# Patient Record
Sex: Female | Born: 1960
Health system: Southern US, Community
[De-identification: ages and names within clinical notes are randomized; demographics above are authoritative.]

## PROBLEM LIST (undated history)

## (undated) DIAGNOSIS — M8580 Other specified disorders of bone density and structure, unspecified site: Secondary | ICD-10-CM

## (undated) DIAGNOSIS — H01009 Unspecified blepharitis unspecified eye, unspecified eyelid: Secondary | ICD-10-CM

## (undated) DIAGNOSIS — K6289 Other specified diseases of anus and rectum: Secondary | ICD-10-CM

## (undated) DIAGNOSIS — N890 Mild vaginal dysplasia: Secondary | ICD-10-CM

## (undated) DIAGNOSIS — G43109 Migraine with aura, not intractable, without status migrainosus: Secondary | ICD-10-CM

## (undated) DIAGNOSIS — R32 Unspecified urinary incontinence: Secondary | ICD-10-CM

## (undated) DIAGNOSIS — I493 Ventricular premature depolarization: Secondary | ICD-10-CM

## (undated) DIAGNOSIS — D069 Carcinoma in situ of cervix, unspecified: Secondary | ICD-10-CM

## (undated) DIAGNOSIS — Z8619 Personal history of other infectious and parasitic diseases: Secondary | ICD-10-CM

## (undated) HISTORY — PX: PILONIDAL CYST EXCISION: SHX744

## (undated) HISTORY — DX: Ventricular premature depolarization: I49.3

## (undated) HISTORY — DX: Personal history of other infectious and parasitic diseases: Z86.19

## (undated) HISTORY — DX: Migraine with aura, not intractable, without status migrainosus: G43.109

## (undated) HISTORY — DX: Other specified disorders of bone density and structure, unspecified site: M85.80

## (undated) HISTORY — DX: Unspecified urinary incontinence: R32

## (undated) HISTORY — PX: VAGINAL HYSTERECTOMY: SUR661

## (undated) HISTORY — PX: GALLBLADDER SURGERY: SHX652

## (undated) HISTORY — DX: Unspecified blepharitis unspecified eye, unspecified eyelid: H01.009

## (undated) HISTORY — DX: Carcinoma in situ of cervix, unspecified: D06.9

## (undated) HISTORY — DX: Other specified diseases of anus and rectum: K62.89

## (undated) HISTORY — DX: Mild vaginal dysplasia: N89.0

---

## 1998-11-19 ENCOUNTER — Emergency Department (HOSPITAL_COMMUNITY): Admission: EM | Admit: 1998-11-19 | Discharge: 1998-11-19 | Payer: Self-pay | Admitting: Emergency Medicine

## 1999-06-21 ENCOUNTER — Emergency Department (HOSPITAL_COMMUNITY): Admission: EM | Admit: 1999-06-21 | Discharge: 1999-06-21 | Payer: Self-pay | Admitting: Emergency Medicine

## 1999-06-21 ENCOUNTER — Encounter: Payer: Self-pay | Admitting: Emergency Medicine

## 1999-07-01 ENCOUNTER — Ambulatory Visit (HOSPITAL_COMMUNITY): Admission: RE | Admit: 1999-07-01 | Discharge: 1999-07-02 | Payer: Self-pay | Admitting: General Surgery

## 2000-06-17 ENCOUNTER — Emergency Department (HOSPITAL_COMMUNITY): Admission: EM | Admit: 2000-06-17 | Discharge: 2000-06-17 | Payer: Self-pay

## 2001-02-24 ENCOUNTER — Emergency Department (HOSPITAL_COMMUNITY): Admission: EM | Admit: 2001-02-24 | Discharge: 2001-02-24 | Payer: Self-pay | Admitting: Emergency Medicine

## 2001-02-24 ENCOUNTER — Encounter: Payer: Self-pay | Admitting: Emergency Medicine

## 2010-10-13 DIAGNOSIS — D069 Carcinoma in situ of cervix, unspecified: Secondary | ICD-10-CM

## 2010-10-13 HISTORY — PX: OTHER SURGICAL HISTORY: SHX169

## 2010-10-13 HISTORY — DX: Carcinoma in situ of cervix, unspecified: D06.9

## 2011-01-10 ENCOUNTER — Other Ambulatory Visit: Payer: Self-pay | Admitting: Orthopedic Surgery

## 2011-01-10 ENCOUNTER — Ambulatory Visit (HOSPITAL_COMMUNITY)
Admission: RE | Admit: 2011-01-10 | Discharge: 2011-01-10 | Disposition: A | Discharge status: Home / Self Care | Payer: Commercial Managed Care - PPO | Source: Ambulatory Visit | Attending: Orthopedic Surgery | Admitting: Orthopedic Surgery

## 2011-01-10 DIAGNOSIS — X58XXXA Exposure to other specified factors, initial encounter: Secondary | ICD-10-CM | POA: Insufficient documentation

## 2011-01-10 DIAGNOSIS — T148XXA Other injury of unspecified body region, initial encounter: Secondary | ICD-10-CM

## 2011-01-10 DIAGNOSIS — S52509A Unspecified fracture of the lower end of unspecified radius, initial encounter for closed fracture: Secondary | ICD-10-CM | POA: Insufficient documentation

## 2011-01-10 DIAGNOSIS — S52609A Unspecified fracture of lower end of unspecified ulna, initial encounter for closed fracture: Secondary | ICD-10-CM | POA: Insufficient documentation

## 2011-01-28 ENCOUNTER — Ambulatory Visit (HOSPITAL_BASED_OUTPATIENT_CLINIC_OR_DEPARTMENT_OTHER)
Admission: RE | Admit: 2011-01-28 | Discharge: 2011-01-29 | Disposition: A | Discharge status: Home / Self Care | Payer: 59 | Source: Ambulatory Visit | Attending: Gynecology | Admitting: Gynecology

## 2011-01-28 ENCOUNTER — Other Ambulatory Visit: Payer: Self-pay | Admitting: Gynecology

## 2011-01-28 DIAGNOSIS — D069 Carcinoma in situ of cervix, unspecified: Secondary | ICD-10-CM | POA: Insufficient documentation

## 2011-01-28 DIAGNOSIS — Z01812 Encounter for preprocedural laboratory examination: Secondary | ICD-10-CM | POA: Insufficient documentation

## 2011-01-28 LAB — HEMOGLOBIN AND HEMATOCRIT, BLOOD
HCT: 43 % (ref 36.0–46.0)
Hemoglobin: 14.6 g/dL (ref 12.0–15.0)

## 2011-01-28 LAB — POCT HEMOGLOBIN-HEMACUE: Hemoglobin: 14.5 g/dL (ref 12.0–15.0)

## 2011-01-28 LAB — POCT PREGNANCY, URINE: Preg Test, Ur: NEGATIVE

## 2011-03-12 ENCOUNTER — Other Ambulatory Visit (HOSPITAL_COMMUNITY): Payer: Self-pay | Admitting: Orthopedic Surgery

## 2011-03-12 DIAGNOSIS — R52 Pain, unspecified: Secondary | ICD-10-CM

## 2011-03-17 ENCOUNTER — Inpatient Hospital Stay (HOSPITAL_COMMUNITY): Admission: RE | Admit: 2011-03-17 | Payer: 59 | Source: Ambulatory Visit

## 2011-09-09 ENCOUNTER — Other Ambulatory Visit: Payer: Self-pay | Admitting: Gynecology

## 2012-03-16 ENCOUNTER — Other Ambulatory Visit: Payer: Self-pay | Admitting: Gynecology

## 2012-06-11 ENCOUNTER — Emergency Department (HOSPITAL_COMMUNITY)
Admission: EM | Admit: 2012-06-11 | Discharge: 2012-06-11 | Disposition: A | Discharge status: Home / Self Care | Payer: 59 | Source: Home / Self Care | Attending: Emergency Medicine | Admitting: Emergency Medicine

## 2012-06-11 ENCOUNTER — Encounter (HOSPITAL_COMMUNITY): Payer: Self-pay | Admitting: Emergency Medicine

## 2012-06-11 DIAGNOSIS — J40 Bronchitis, not specified as acute or chronic: Secondary | ICD-10-CM

## 2012-06-11 MED ORDER — HYDROCOD POLST-CHLORPHEN POLST 10-8 MG/5ML PO LQCR: 5.0000 mL | Freq: Two times a day (BID) | ORAL | Status: DC | PRN

## 2012-06-11 MED ORDER — ALBUTEROL SULFATE HFA 108 (90 BASE) MCG/ACT IN AERS: 1.0000 | INHALATION_SPRAY | Freq: Four times a day (QID) | RESPIRATORY_TRACT | Status: DC | PRN

## 2012-06-11 MED ORDER — PREDNISONE 5 MG PO KIT: 1.0000 | PACK | Freq: Every day | ORAL | Status: DC

## 2012-06-11 NOTE — ED Provider Notes (Signed)
Chief Complaint  Patient presents with  . 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 She's had some soreness in her chest but no wheezing or tightness. She's also had nasal congestion with clear drainage, sinus pressure, sore throat, postnasal drip, and low-grade fever.  Review of Systems:  Other than noted above, the patient denies any of the following symptoms. Systemic:  No fever, chills, sweats, fatigue, myalgias, headache, or anorexia. Eye:  No redness, pain or drainage. ENT:  No earache, ear congestion, nasal congestion, sneezing, rhinorrhea, sinus pressure, sinus pain, post nasal drip, or sore throat. Lungs:  No cough, sputum production, wheezing, shortness of breath, or chest pain. GI:  No abdominal pain, nausea, vomiting, or diarrhea.  PMFSH:  Past medical history, family history, social history, meds, and allergies were reviewed.  Physical Exam:   Vital signs:  BP 156/112  Pulse 96  Temp 98.7 F (37.1 C) (Oral)  Resp 18  SpO2 99% General:  Alert, in no distress. Eye:  No conjunctival injection or drainage. Lids were normal. ENT:  TMs and canals were normal, without erythema or inflammation.  Nasal mucosa was clear and uncongested, without drainage.  Mucous membranes were moist.  Pharynx was clear, without exudate or drainage.  There were no oral ulcerations or lesions. Neck:  Supple, no adenopathy, tenderness or mass. Lungs:  No respiratory distress.  Lungs were clear to auscultation, without wheezes, rales or rhonchi.  Breath sounds were clear and equal bilaterally.  Heart:  Regular rhythm, without gallops, murmers or rubs. Skin:  Clear, warm, and dry, without rash or lesions.  Assessment:  The encounter diagnosis was Bronchitis.  Plan:   1.  The following meds were prescribed:   New Prescriptions   ALBUTEROL  (PROVENTIL HFA;VENTOLIN HFA) 108 (90 BASE) MCG/ACT INHALER    Inhale 1-2 puffs into the lungs every 6 (six) hours as needed for wheezing.   CHLORPHENIRAMINE-HYDROCODONE (TUSSIONEX) 10-8 MG/5ML LQCR    Take 5 mLs by mouth every 12 (twelve) hours as needed.   PREDNISONE 5 MG KIT    Take 1 kit (5 mg total) by mouth daily after breakfast. Prednisone 5 mg 6 day dosepack.  Take as directed.   2.  The patient was instructed in symptomatic care and handouts were given. 3.  The patient was told to return if becoming worse in any way, if no better in 3 or 4 days, and given some red flag symptoms that would indicate earlier return.   Reuben Likes, MD 06/11/12 2126

## 2012-06-11 NOTE — ED Notes (Signed)
Pt c/o dry cough x7 days... States she is just getting over Bronchitis.

## 2012-07-30 IMAGING — CT CT 3D ACQUISTION WKST
3 of 5 series · 8 of 33 positions shown, 9 images · non-contrast
Comparison: None

CLINICAL DATA: Right wrist fracture.

CT RIGHT WRIST WITHOUT CONTRAST
TECHNIQUE: Multidetector CT imaging of the right wrist was
performed according to the standard protocol without intravenous
contrast. Multiplanar CT image reconstructions were also generated.
TECHNIQUE: 3-dimensional CT images were rendered by post-
processing of the original CT data at the CT scanner.  The 3-
dimensional CT images were interpreted, and findings were reported
in the accompanying complete CT report for this study.

[axial supination st · axial · 0.20mm/px · z∈[+995,+1077]mm · 3 of 86 slices shown, 4 images]
[im 22/86  soft-tissue]
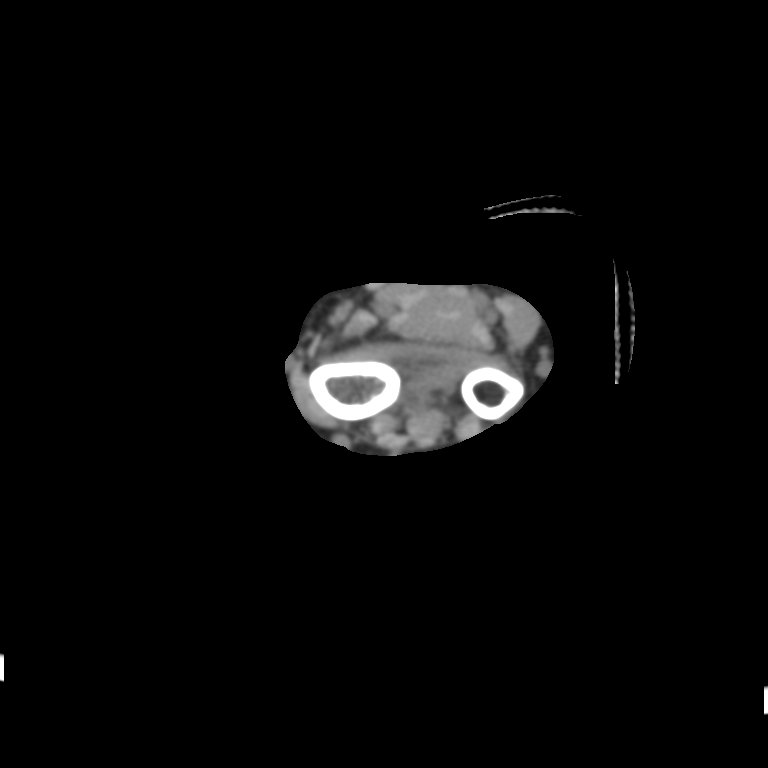
[im 22/86  bone]
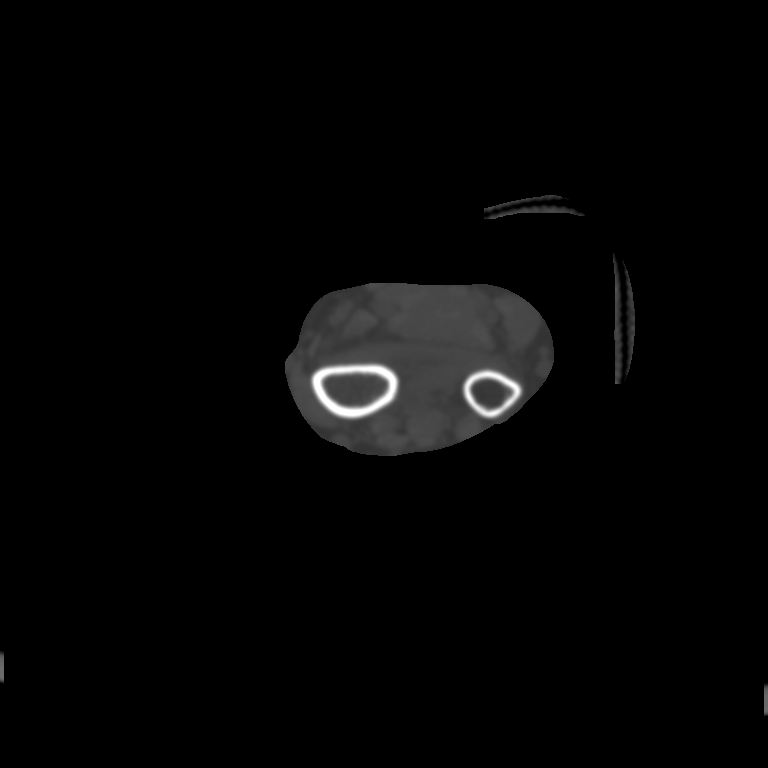
[im 43/86  bone]
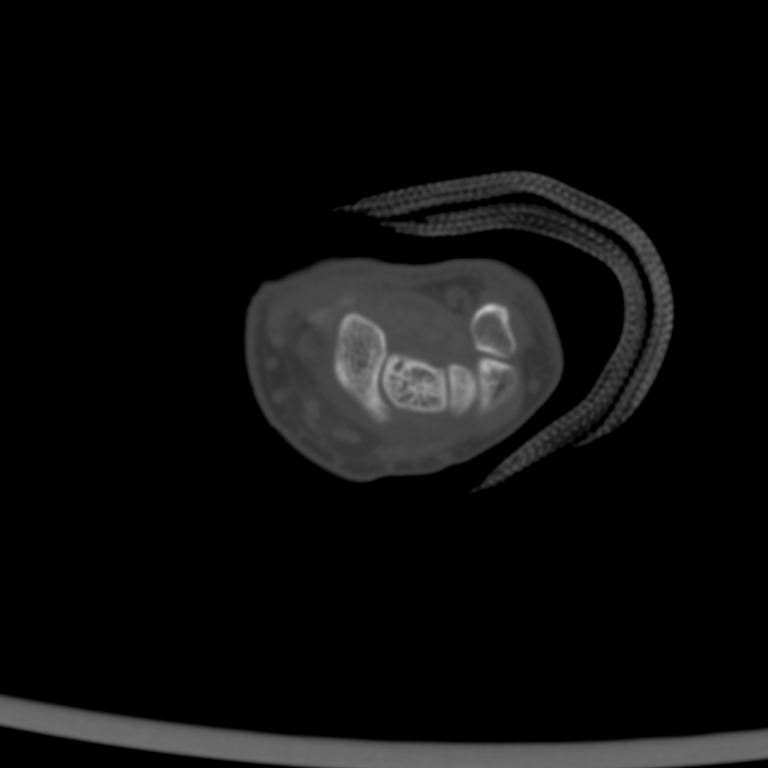
[im 64/86  bone]
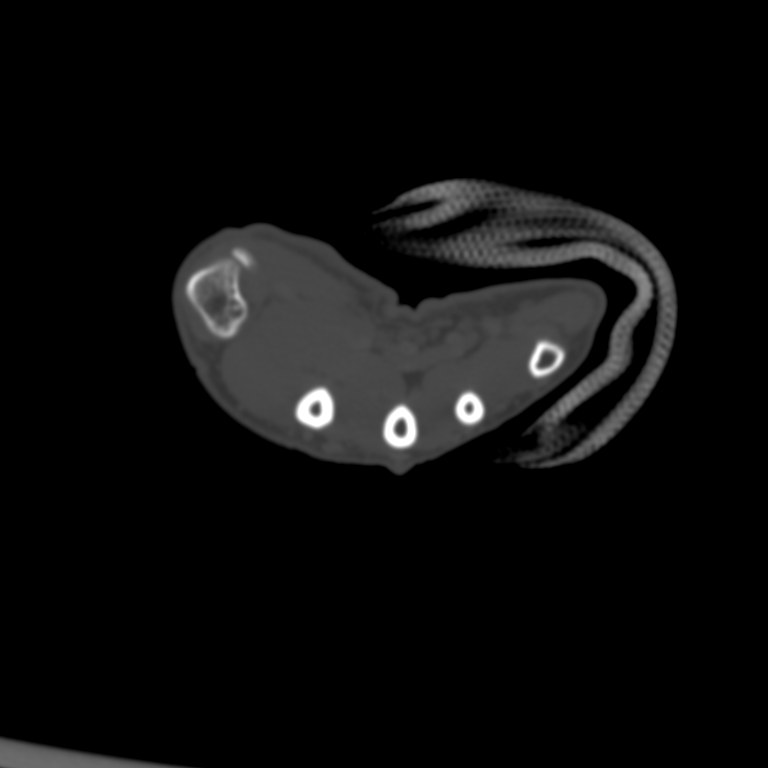

[axial bone supination · axial · 0.20mm/px · z∈[+1004,+1053]mm · 2 of 77 slices shown]
[im 26/77  bone]
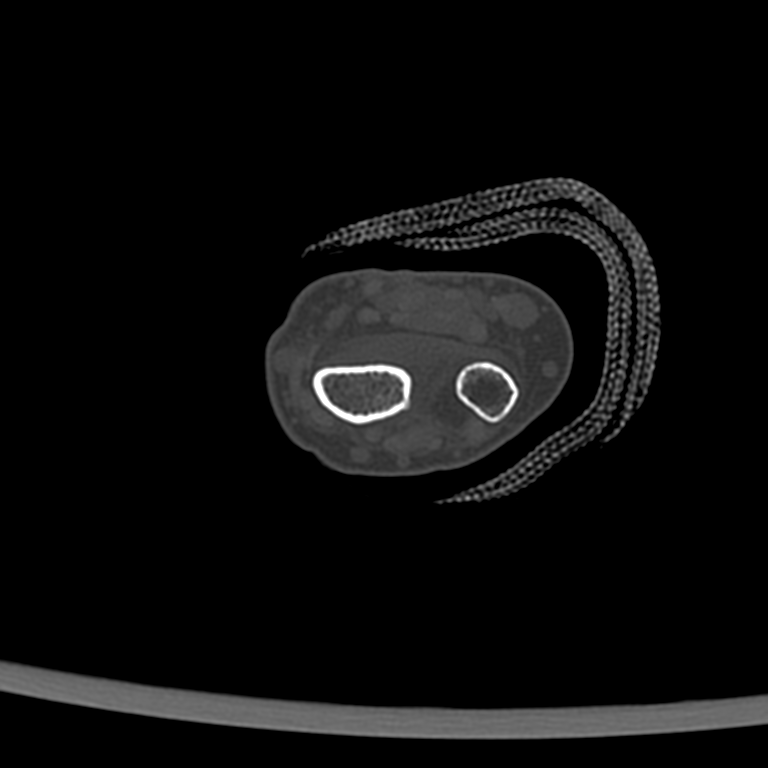
[im 51/77  bone]
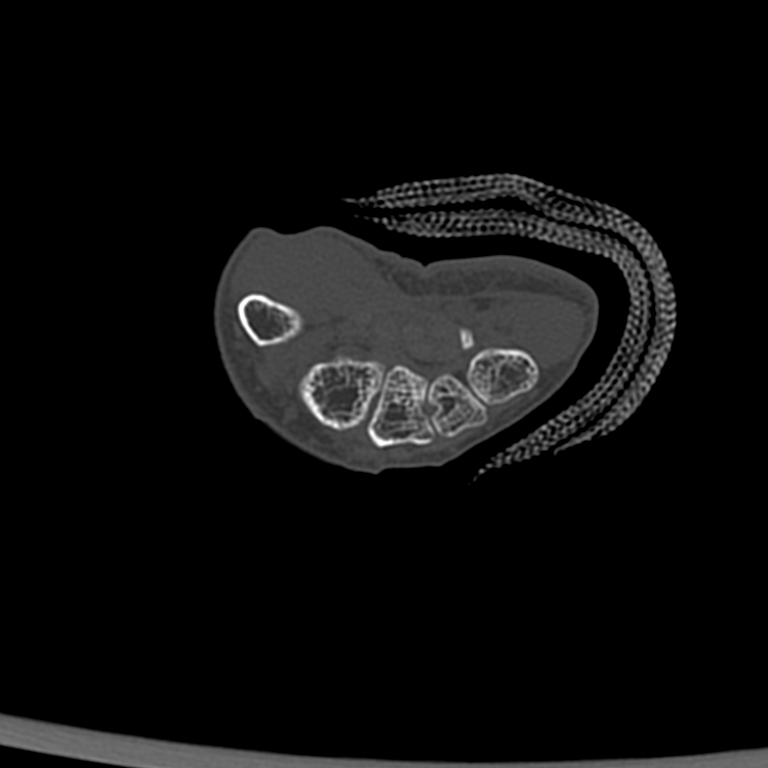

[coronal bone supination · coronal · 0.27mm/px · 3 of 23 slices shown]
[im 5/23  bone]
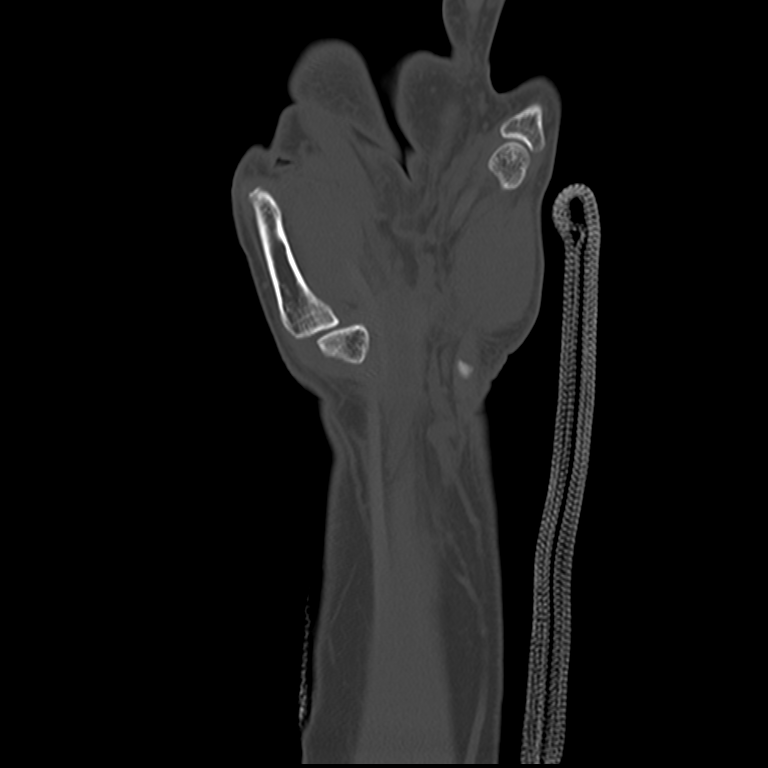
[im 9/23  bone]
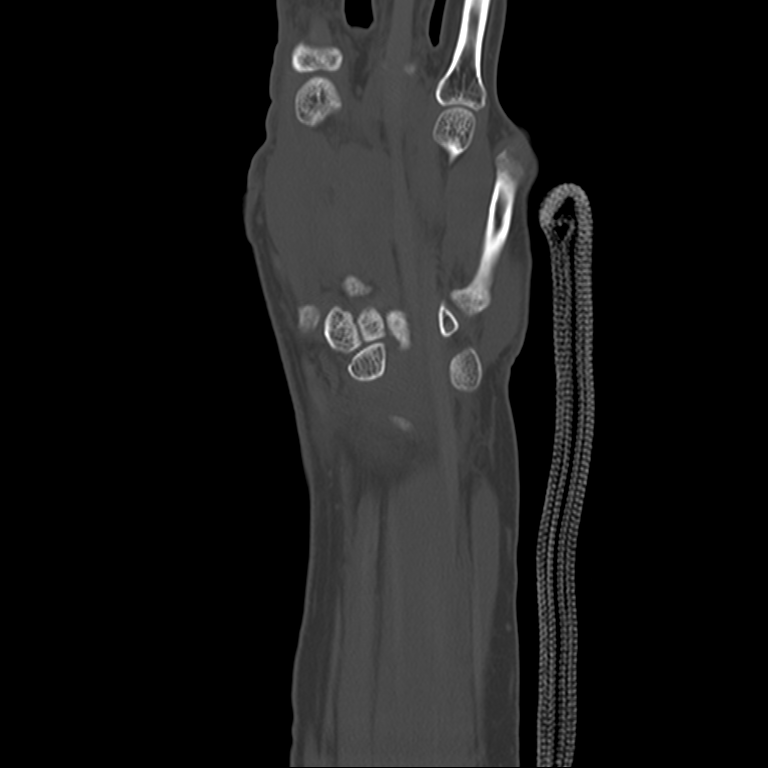
[im 14/23  bone]
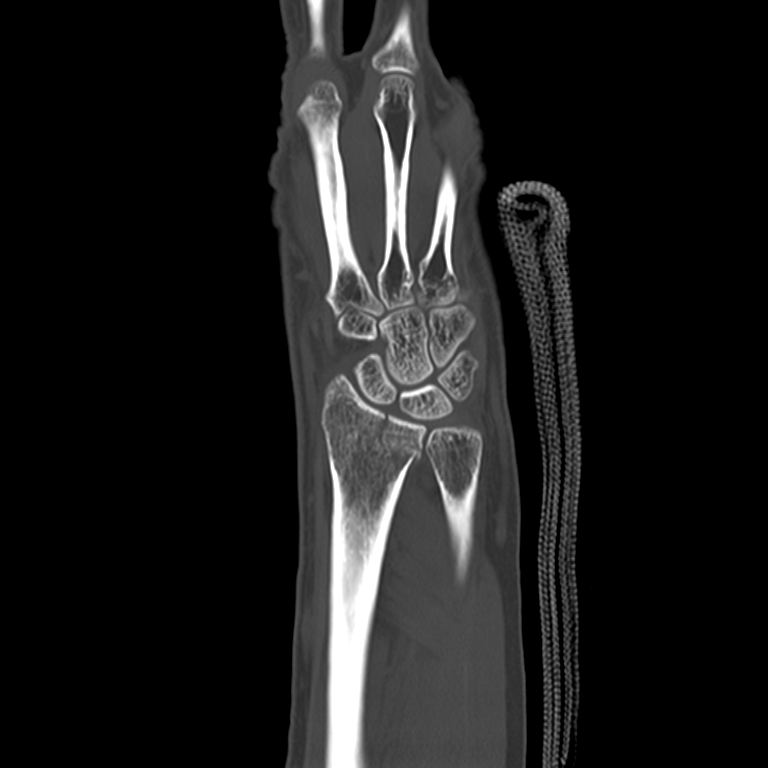

[8 of 33 positions shown; findings below may reference images not displayed]

FINDINGS: The wrist was scanned in neutral, supination and
pronation.  There is a nondisplaced minimally impacted intra-
articular fracture of the distal radius.  Less than 1 mm of
separation of the intra-articular fracture.  The intercarpal joint
spaces are maintained.  No carpal fractures are seen.  There is a
tiny avulsion fracture involving the ulnar styloid.  The radioulnar
joint spaces maintained with supination and pronation.
IMPRESSION: 1.  Nondisplaced intra-articular fracture of the distal radius
involving the ulnar aspect.  Minimal impaction along the ulnar
aspect but less than 1 cm of separation of the intra-articular
fracture.
2.  Tiny ulnar styloid avulsion fracture.
3.  No findings to suggest disruption of the radioulnar ligament.


3-DIMENSIONAL CT IMAGE RENDERING AT CT SCANNER:

## 2012-10-13 DIAGNOSIS — M8580 Other specified disorders of bone density and structure, unspecified site: Secondary | ICD-10-CM

## 2012-10-13 HISTORY — DX: Other specified disorders of bone density and structure, unspecified site: M85.80

## 2012-10-15 ENCOUNTER — Other Ambulatory Visit: Payer: Self-pay | Admitting: Gynecology

## 2013-02-15 ENCOUNTER — Emergency Department (HOSPITAL_COMMUNITY)
Admission: EM | Admit: 2013-02-15 | Discharge: 2013-02-15 | Disposition: A | Discharge status: Home / Self Care | Payer: Commercial Managed Care - PPO | Source: Home / Self Care

## 2013-02-15 ENCOUNTER — Encounter (HOSPITAL_COMMUNITY): Payer: Self-pay | Admitting: Emergency Medicine

## 2013-02-15 DIAGNOSIS — B029 Zoster without complications: Secondary | ICD-10-CM

## 2013-02-15 MED ORDER — OXYCODONE-ACETAMINOPHEN 5-325 MG PO TABS: 1.0000 | ORAL_TABLET | Freq: Four times a day (QID) | ORAL | Status: DC | PRN

## 2013-02-15 MED ORDER — VALACYCLOVIR HCL 1 G PO TABS: 1000.0000 mg | ORAL_TABLET | Freq: Three times a day (TID) | ORAL | Status: DC

## 2013-02-15 NOTE — ED Notes (Signed)
Pt was exposed to shingles 2 weeks ago. Last nigh felt sharp lumbar pain and then noticed a rash that has spread around to abdomen. No pus or weeping. Patient is alert and oriented.

## 2013-02-15 NOTE — ED Provider Notes (Signed)
History     CSN: [id number]  Arrival date & time 02/15/13  1412   First MD Initiated Contact with Patient 02/15/13 1510      Chief Complaint  Patient presents with  . Herpes Zoster    (Consider location/radiation/quality/duration/timing/severity/associated sxs/prior treatment) HPI Comments: 2 days ago this 52 year old female developed pain in her right mid back. The pain progressed over the ensuing 24 hours as she thought it was musculoskeletal. This was followed by a grouping of papulovesicular rash in the right posterior back. There is a band of pain and paresthesia along the eighth and ninth dermatome from the spine to right lateral abdomen just beyond the anterior axillary line. Denies fever or other constitutional symptoms.   Past Medical History  Diagnosis Date  . Cervical cancer     Past Surgical History  Procedure Laterality Date  . Gallbladder surgery    . Abdominal hysterectomy      No family history on file.  History  Substance Use Topics  . Smoking status: Never Smoker   . Smokeless tobacco: Not on file  . Alcohol Use: 0.6 oz/week    1 Glasses of wine per week     Comment: occasionally    OB History   Grav Para Term Preterm Abortions TAB SAB Ect Mult Living                  Review of Systems  Constitutional: Negative.   HENT: Negative.   Respiratory: Negative.   Skin: Positive for rash.  All other systems reviewed and are negative.    Allergies  Penicillins  Home Medications   Current Outpatient Rx  Name  Route  Sig  Dispense  Refill  . estradiol (VIVELLE-DOT) 0.05 MG/24HR   Transdermal   Place 1 patch onto the skin 1 day or 1 dose.         . zolpidem (AMBIEN) 5 MG tablet   Oral   Take 5 mg by mouth at bedtime as needed.         Marland Kitchen albuterol (PROVENTIL HFA;VENTOLIN HFA) 108 (90 BASE) MCG/ACT inhaler   Inhalation   Inhale 1-2 puffs into the lungs every 6 (six) hours as needed for wheezing.   1 Inhaler   0   .  chlorpheniramine-HYDROcodone (TUSSIONEX) 10-8 MG/5ML LQCR   Oral   Take 5 mLs by mouth every 12 (twelve) hours as needed.   140 mL   0   . oxyCODONE-acetaminophen (PERCOCET/ROXICET) 5-325 MG per tablet   Oral   Take 1-2 tablets by mouth every 6 (six) hours as needed for pain.   20 tablet   0   . PredniSONE 5 MG KIT   Oral   Take 1 kit (5 mg total) by mouth daily after breakfast. Prednisone 5 mg 6 day dosepack.  Take as directed.   1 kit   0   . valACYclovir (VALTREX) 1000 MG tablet   Oral   Take 1 tablet (1,000 mg total) by mouth 3 (three) times daily.   21 tablet   0     BP 113/79  Pulse 90  Temp(Src) 98.5 F (36.9 C) (Oral)  Resp 16  SpO2 99%  Physical Exam  Nursing note and vitals reviewed. Constitutional: She is oriented to person, place, and time. She appears well-developed and well-nourished. No distress.  Eyes: Conjunctivae and EOM are normal.  Neck: Normal range of motion. Neck supple.  Pulmonary/Chest: Effort normal.  Musculoskeletal: Normal range of motion. She exhibits no  edema.  Neurological: She is alert and oriented to person, place, and time. She exhibits normal muscle tone.  Skin: Skin is warm and dry. Rash noted.  Pain 2 superficial pale patient along the right eighth and ninth dermatome. The papulovesicular rash is distributed in crops some isolated lesions primarily to the posterior aspect of the dermatome.  Psychiatric: She has a normal mood and affect.    ED Course  Procedures (including critical care time)  Labs Reviewed - No data to display No results found.   1. Herpes zoster       MDM  rash and pain consistent with herpes zoster. Valtrex 1 g 3 times a day for 7 days Percocet 5 mg one every 4-6 hours when necessary pain.     Mikael Spray, NP 02/15/13 [phone]

## 2013-02-18 NOTE — ED Provider Notes (Signed)
Medical screening examination/treatment/procedure(s) were performed by resident physician or non-physician practitioner and as supervising physician I was immediately available for consultation/collaboration.   Elsi Stelzer DOUGLAS MD.   Gustavo Dispenza D Chett Taniguchi, MD 02/18/13 1810 

## 2013-02-21 ENCOUNTER — Emergency Department (HOSPITAL_COMMUNITY)
Admission: EM | Admit: 2013-02-21 | Discharge: 2013-02-21 | Disposition: A | Discharge status: Home / Self Care | Payer: Commercial Managed Care - PPO | Source: Home / Self Care | Attending: Emergency Medicine | Admitting: Emergency Medicine

## 2013-02-21 ENCOUNTER — Encounter (HOSPITAL_COMMUNITY): Payer: Self-pay | Admitting: Emergency Medicine

## 2013-02-21 DIAGNOSIS — B029 Zoster without complications: Secondary | ICD-10-CM

## 2013-02-21 MED ORDER — IBUPROFEN 200 MG PO TABS: 600.0000 mg | ORAL_TABLET | Freq: Three times a day (TID) | ORAL | Status: DC | PRN

## 2013-02-21 MED ORDER — HYDROCODONE-ACETAMINOPHEN 5-325 MG PO TABS: 2.0000 | ORAL_TABLET | Freq: Four times a day (QID) | ORAL | Status: DC | PRN

## 2013-02-21 NOTE — ED Provider Notes (Signed)
History     CSN: [id number]  Arrival date & time 02/21/13  1022   First MD Initiated Contact with Patient 02/21/13 1039      Chief Complaint  Patient presents with  . Herpes Zoster    (Consider location/radiation/quality/duration/timing/severity/associated sxs/prior treatment) HPI Comments: Patient presents urgent care describing that although her pain is somewhat better she continues to have moderate discomfort originating from her " shingles in her right lateral chest wall". She is requesting to take more pain medicines, she started with ibuprofen as she has finished her Percocet with some partial relief. Patient denies any fevers, lesions have not gotten infected, no general malaise and no difficulty breathing.  Patient is a 52 y.o. female presenting with rash. The history is provided by the patient.  Rash Quality: painful, peeling, redness and scaling   Quality: not blistering, not draining, not swelling and not weeping   Pain details:    Quality:  Aching and burning   Severity:  Moderate   Onset quality:  Gradual   Duration:  2 weeks   Timing:  Intermittent   Progression:  Improving Severity:  Moderate Progression:  Unchanged Context: not animal contact   Relieved by: Percocets. Ineffective treatments: Anti-herpetic meds. Associated symptoms: no abdominal pain, no fatigue, no fever, no headaches, no hoarse voice, no joint pain, no myalgias, no nausea, no shortness of breath, no sore throat and not vomiting     Past Medical History  Diagnosis Date  . Cervical cancer     Past Surgical History  Procedure Laterality Date  . Gallbladder surgery    . Abdominal hysterectomy      History reviewed. No pertinent family history.  History  Substance Use Topics  . Smoking status: Never Smoker   . Smokeless tobacco: Not on file  . Alcohol Use: 0.6 oz/week    1 Glasses of wine per week     Comment: occasionally    OB History   Grav Para Term Preterm Abortions TAB SAB  Ect Mult Living                  Review of Systems  Constitutional: Negative for fever, activity change, appetite change and fatigue.  HENT: Negative for sore throat, hoarse voice, facial swelling and neck pain.   Respiratory: Negative for shortness of breath.   Gastrointestinal: Negative for nausea, vomiting and abdominal pain.  Musculoskeletal: Negative for myalgias, back pain, arthralgias and gait problem.  Skin: Positive for rash. Negative for color change and pallor.  Neurological: Negative for headaches.    Allergies  Penicillins  Home Medications   Current Outpatient Rx  Name  Route  Sig  Dispense  Refill  . estradiol (VIVELLE-DOT) 0.05 MG/24HR   Transdermal   Place 1 patch onto the skin 1 day or 1 dose.         . albuterol (PROVENTIL HFA;VENTOLIN HFA) 108 (90 BASE) MCG/ACT inhaler   Inhalation   Inhale 1-2 puffs into the lungs every 6 (six) hours as needed for wheezing.   1 Inhaler   0   . chlorpheniramine-HYDROcodone (TUSSIONEX) 10-8 MG/5ML LQCR   Oral   Take 5 mLs by mouth every 12 (twelve) hours as needed.   140 mL   0   . HYDROcodone-acetaminophen (NORCO/VICODIN) 5-325 MG per tablet   Oral   Take 2 tablets by mouth every 6 (six) hours as needed for pain.   15 tablet   0   . ibuprofen (ADVIL,MOTRIN) 200 MG tablet  Oral   Take 3 tablets (600 mg total) by mouth every 8 (eight) hours as needed for pain.   30 tablet   0   . oxyCODONE-acetaminophen (PERCOCET/ROXICET) 5-325 MG per tablet   Oral   Take 1-2 tablets by mouth every 6 (six) hours as needed for pain.   20 tablet   0   . PredniSONE 5 MG KIT   Oral   Take 1 kit (5 mg total) by mouth daily after breakfast. Prednisone 5 mg 6 day dosepack.  Take as directed.   1 kit   0   . valACYclovir (VALTREX) 1000 MG tablet   Oral   Take 1 tablet (1,000 mg total) by mouth 3 (three) times daily.   21 tablet   0   . zolpidem (AMBIEN) 5 MG tablet   Oral   Take 5 mg by mouth at bedtime as needed.             BP 165/111  Pulse 82  Temp(Src) 98 F (36.7 C) (Oral)  Resp 18  SpO2 100%  Physical Exam  Nursing note and vitals reviewed. Constitutional: She appears well-developed and well-nourished.  Pulmonary/Chest: Effort normal and breath sounds normal.    Musculoskeletal: She exhibits no tenderness.  Neurological: She is alert.  Skin: Rash noted. There is erythema.    ED Course  Procedures (including critical care time)  Labs Reviewed - No data to display No results found.   1. Shingles       MDM  Pain management, herpetic neuralgic type pain from zoster. Patient looks comfortable having no muscular skeletal chest wall deficits. Patient has been prescribed 15 tablets of Lortab encouraged to take ibuprofen 600 mg every 8 hours and to use Lortab only for breakthrough pain patient agrees with current treatment. We also discussed, to titrate down from Percocets. She agrees.        Jimmie Molly, MD 02/21/13 1116

## 2013-02-21 NOTE — ED Notes (Signed)
Pt is here for shingles x1 week Sx include: pain; outbreak on right back right side to right abd Denies: f/v/n/d  She is alert and oriented w/no signs of acute distress.

## 2013-10-18 ENCOUNTER — Ambulatory Visit: Payer: Self-pay | Admitting: Gynecology

## 2013-10-25 ENCOUNTER — Other Ambulatory Visit: Payer: Self-pay

## 2013-10-25 ENCOUNTER — Other Ambulatory Visit (HOSPITAL_COMMUNITY)
Admission: RE | Admit: 2013-10-25 | Discharge: 2013-10-25 | Disposition: A | Discharge status: Home / Self Care | Payer: 59 | Source: Ambulatory Visit | Attending: Gynecology | Admitting: Gynecology

## 2013-10-25 ENCOUNTER — Ambulatory Visit (INDEPENDENT_AMBULATORY_CARE_PROVIDER_SITE_OTHER): Payer: 59 | Admitting: Gynecology

## 2013-10-25 ENCOUNTER — Encounter: Payer: Self-pay | Admitting: Gynecology

## 2013-10-25 VITALS — BP 120/70 | Ht 65.0 in | Wt 166.0 lb

## 2013-10-25 DIAGNOSIS — Z1151 Encounter for screening for human papillomavirus (HPV): Secondary | ICD-10-CM | POA: Insufficient documentation

## 2013-10-25 DIAGNOSIS — M8580 Other specified disorders of bone density and structure, unspecified site: Secondary | ICD-10-CM

## 2013-10-25 DIAGNOSIS — Z79890 Hormone replacement therapy (postmenopausal): Secondary | ICD-10-CM

## 2013-10-25 DIAGNOSIS — Z01419 Encounter for gynecological examination (general) (routine) without abnormal findings: Secondary | ICD-10-CM

## 2013-10-25 DIAGNOSIS — Z124 Encounter for screening for malignant neoplasm of cervix: Secondary | ICD-10-CM | POA: Insufficient documentation

## 2013-10-25 DIAGNOSIS — M949 Disorder of cartilage, unspecified: Secondary | ICD-10-CM

## 2013-10-25 DIAGNOSIS — M899 Disorder of bone, unspecified: Secondary | ICD-10-CM

## 2013-10-25 LAB — LIPID PANEL
Cholesterol: 184 mg/dL (ref 0–200)
HDL: 56 mg/dL (ref >39)
LDL Cholesterol: 98 mg/dL (ref 0–99)
Total CHOL/HDL Ratio: 3.3 Ratio
Triglycerides: 148 mg/dL (ref <150)
VLDL: 30 mg/dL (ref 0–40)

## 2013-10-25 LAB — CBC WITH DIFFERENTIAL/PLATELET
Basophils Absolute: 0 10*3/uL (ref 0.0–0.1)
Basophils Relative: 0 % (ref 0–1)
Eosinophils Absolute: 0.2 10*3/uL (ref 0.0–0.7)
Eosinophils Relative: 2 % (ref 0–5)
HCT: 42 % (ref 36.0–46.0)
Hemoglobin: 14.7 g/dL (ref 12.0–15.0)
Lymphocytes Relative: 26 % (ref 12–46)
Lymphs Abs: 2.9 10*3/uL (ref 0.7–4.0)
MCH: 29.4 pg (ref 26.0–34.0)
MCHC: 35 g/dL (ref 30.0–36.0)
MCV: 84 fL (ref 78.0–100.0)
Monocytes Absolute: 0.5 10*3/uL (ref 0.1–1.0)
Monocytes Relative: 5 % (ref 3–12)
Neutro Abs: 7.4 10*3/uL (ref 1.7–7.7)
Neutrophils Relative %: 67 % (ref 43–77)
Platelets: 370 10*3/uL (ref 150–400)
RBC: 5 MIL/uL (ref 3.87–5.11)
RDW: 14.1 % (ref 11.5–15.5)
WBC: 11 10*3/uL — ABNORMAL HIGH (ref 4.0–10.5)

## 2013-10-25 LAB — COMPREHENSIVE METABOLIC PANEL
ALT: 14 U/L (ref 0–35)
AST: 18 U/L (ref 0–37)
Albumin: 4.6 g/dL (ref 3.5–5.2)
Alkaline Phosphatase: 97 U/L (ref 39–117)
BUN: 16 mg/dL (ref 6–23)
CO2: 27 mEq/L (ref 19–32)
Calcium: 9.4 mg/dL (ref 8.4–10.5)
Chloride: 105 mEq/L (ref 96–112)
Creat: 0.65 mg/dL (ref 0.50–1.10)
Glucose, Bld: 79 mg/dL (ref 70–99)
Potassium: 4 mEq/L (ref 3.5–5.3)
Sodium: 141 mEq/L (ref 135–145)
Total Bilirubin: 0.6 mg/dL (ref 0.3–1.2)
Total Protein: 6.7 g/dL (ref 6.0–8.3)

## 2013-10-25 MED ORDER — ZOLPIDEM TARTRATE 10 MG PO TABS: 10.0000 mg | ORAL_TABLET | Freq: Every evening | ORAL | Status: DC | PRN

## 2013-10-25 MED ORDER — HALOBETASOL-AMMONIUM LACTATE 0.05 & 12 % (CREAM) EX KIT: PACK | CUTANEOUS | Status: DC

## 2013-10-25 MED ORDER — ESTRADIOL 0.075 MG/24HR TD PTTW: 1.0000 | MEDICATED_PATCH | TRANSDERMAL | Status: DC

## 2013-10-25 NOTE — Addendum Note (Signed)
Addended by: Dayna BarkerGARDNER, KIMBERLY K on: 10/25/2013 04:23 PM   Modules accepted: Orders

## 2013-10-25 NOTE — Progress Notes (Addendum)
Francesco SorKaren Mcgranahan 20-Jan-1961 [id number]        53 y.o.  G1P0010 new patient for annual exam.  Former patient of Dr. Nicholas LoseLomax. Several issues noted below.  Past medical history,surgical history, problem list, medications, allergies, family history and social history were all reviewed and documented in the EPIC chart.  ROS:  Performed and pertinent positives and negatives are included in the history, assessment and plan .  Exam: Kim assistant Filed Vitals:   10/25/13 1407  BP: 120/70  Height: 5\' 5"  (1.651 m)  Weight: 166 lb (75.297 kg)   General appearance  Normal Skin grossly normal Head/Neck normal with no cervical or supraclavicular adenopathy thyroid normal Lungs  clear Cardiac RR, without RMG Abdominal  soft, nontender, without masses, organomegaly or hernia Breasts  examined lying and sitting without masses, retractions, discharge or axillary adenopathy. Pelvic  Ext/BUS/vagina  Normal. Pap smear of cuff done  Adnexa  Without masses or tenderness    Anus and perineum  Normal   Rectovaginal  Normal sphincter tone without palpated masses or tenderness.    Assessment/Plan:  53 y.o. G1P0010 female for annual exam.   1. Status post TVH for AIS/HGSIL. On Vivelle patches cutting 0.1 mg patch in half twice weekly. Still noticing temperature swings with sweats. I reviewed the whole issue of HRT with her to include the WHI study with increased risk of stroke, heart attack, DVT and breast cancer. The ACOG and NAMS statements for lowest dose for the shortest period of time reviewed. Transdermal versus oral first-pass effect benefit discussed. Recommend increasing to Vivelle 0.075 patches twice weekly to see if this doesn't alleviate her symptoms. The issues as to when to stop ERT reviewed that at this point given her continuing symptoms both the patient and I feel continuing at this point warranted. Will check TSH level to make sure it's not thyroid dysfunction. 2. Pap smear 2014. Pap/HPV today.  History of AIS/HGSIL status post vaginal hysterectomy 2012. Continue with annual Pap smears for now. 3. Mammography 10/2012. Need to do mammogram now continue with annual mammography discussed. SBE monthly reviewed. 4. Osteopenia. DEXA reported one to 2 years ago with osteopenia. Apparently had a trial of Fosamax but could not tolerate the musculoskeletal side effects. Will obtain copy of this and discussed long-term treatment plan. Patient knows to call he within 2 weeks to make sure I have received the report and we discuss it.  Increased calcium vitamin D reviewed. Check vitamin D level today. 5. Colonoscopy 2012. Repeat it are recommended interval. 6. Dry skin. Patient using dermatologic cream that she originally received from dermatologist a Dr. Nicholas LoseLomax has been refilling her for dry skin and I refilled her prescription x1 year. 7. 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 She uses Ambien one half of 10 mg tablet as needed to help her fall asleep and is doing well with this without sleep activity. Ambien 10 mg #30 with 4 refills provided. 8. Health maintenance. Patient requests baseline labs. CBC comprehensive metabolic panel lipid profile hemoglobin A1c TSH vitamin D urinalysis ordered. Followup for DEXA discussion otherwise annually.   Note: This document was prepared with digital dictation and possible smart phrase technology. Any transcriptional errors that result from this process are unintentional.   Dara LordsFONTAINE,Desta Bujak P MD, 3:07 PM 10/25/2013

## 2013-10-25 NOTE — Patient Instructions (Signed)
Try a higher dose estrogen patch. Call me if you have any issues with this. Followup in one year, sooner as needed.

## 2013-10-26 ENCOUNTER — Telehealth: Payer: Self-pay

## 2013-10-26 LAB — URINALYSIS W MICROSCOPIC + REFLEX CULTURE
Bilirubin Urine: NEGATIVE
Casts: NONE SEEN
Crystals: NONE SEEN
Glucose, UA: NEGATIVE mg/dL
Hgb urine dipstick: NEGATIVE
Ketones, ur: NEGATIVE mg/dL
Nitrite: NEGATIVE
Protein, ur: NEGATIVE mg/dL
Specific Gravity, Urine: >1.03 — ABNORMAL HIGH (ref 1.005–1.030)
Urobilinogen, UA: 0.2 mg/dL (ref 0.0–1.0)
pH: 6.5 (ref 5.0–8.0)

## 2013-10-26 LAB — TSH: TSH: 0.52 u[IU]/mL (ref 0.350–4.500)

## 2013-10-26 LAB — HEMOGLOBIN A1C
Hgb A1c MFr Bld: 6 % — ABNORMAL HIGH (ref <5.7)
Mean Plasma Glucose: 126 mg/dL — ABNORMAL HIGH (ref <117)

## 2013-10-26 LAB — VITAMIN D 25 HYDROXY (VIT D DEFICIENCY, FRACTURES): Vit D, 25-Hydroxy: 51 ng/mL (ref 30–89)

## 2013-10-26 MED ORDER — NONFORMULARY OR COMPOUNDED ITEM: Status: DC

## 2013-10-26 MED ORDER — AMMONIUM LACTATE 12 % EX CREA: TOPICAL_CREAM | CUTANEOUS | Status: DC

## 2013-10-26 NOTE — Telephone Encounter (Signed)
I am just reordering what a dermatologist had previously ordered. I have no idea as to what the combination should be. I would recommend what ever was in the previously prescribed cream or if Tamela OddiBetsy knows what the appropriate dosage should be then that's okay.

## 2013-10-26 NOTE — Telephone Encounter (Signed)
Checked with other pharmacies to see if they have it. Also check with [facility] to see if they could formulate something equivalent. If so let patient know other pharmacy or [facility]

## 2013-10-26 NOTE — Telephone Encounter (Signed)
Pharmacy sent a note stating that they are unable to  get the Halobetasol-Ammonium Lactate 0.05 & 12% cream kit from wholesale.  They ask that you please change the Rx.

## 2013-10-26 NOTE — Telephone Encounter (Signed)
Christina Lloyd called back to say she had researched some more and all the cream kit was was a tube of Halobetasol and a tube of Ammonium lactate. She said that both should be available retail pharmacy ordered separately and not as cream kit and she could use her insurance..  She said hopefully patient would know how to use. I called patient to talk with her and she said she never used the Halobetasol cream at all or a cream kit. What she had was simply Ammonium Lactate cream 12%.  This is available at her [facility] and I sent Rx there so that she can use her insurance.

## 2013-10-26 NOTE — Telephone Encounter (Signed)
I checked with [facility], [facility] and [facility] and the Rx is not available. "Product to be discontinued". "No production scheduled".  I [facility] with pharmacist[facility]  She said that Halobetasol Propionate 0.05% cream and Ammonium Lactate 12% cream are each commercially available. She asked is what you are wanting equal parts? If so, she can order the two creams and mix them.

## 2013-10-26 NOTE — Telephone Encounter (Signed)
I spoke with Christina [facility]Betsy, pharmacist[facility] at [facility].  She said she felt certain that it was likely equal parts. She will order for patient and mix.  I checked with patient and she is fine with having [facility] formulate this for her since not available as a commercial Rx.  Rx was phoned in.

## 2013-10-27 ENCOUNTER — Other Ambulatory Visit: Payer: Self-pay | Admitting: Gynecology

## 2013-10-27 ENCOUNTER — Other Ambulatory Visit: Payer: Self-pay

## 2013-10-27 DIAGNOSIS — R7309 Other abnormal glucose: Secondary | ICD-10-CM

## 2013-10-27 MED ORDER — NITROFURANTOIN MONOHYD MACRO 100 MG PO CAPS: 100.0000 mg | ORAL_CAPSULE | Freq: Two times a day (BID) | ORAL | Status: DC

## 2013-10-28 ENCOUNTER — Encounter: Payer: Self-pay | Admitting: Gynecology

## 2013-10-28 LAB — URINE CULTURE: Colony Count: >=100000

## 2013-10-31 ENCOUNTER — Telehealth: Payer: Self-pay | Admitting: Gynecology

## 2013-10-31 ENCOUNTER — Encounter: Payer: Self-pay | Admitting: Gynecology

## 2013-10-31 NOTE — Telephone Encounter (Signed)
Tell patient reviewed last bone density 10/2012 which showed osteopenia but stable if not improved from prior DEXA. Recommend repeating her bone density next year at two-year interval.

## 2013-10-31 NOTE — Telephone Encounter (Signed)
Left on voicemail to call.

## 2013-10-31 NOTE — Telephone Encounter (Signed)
Tell patient reviewed last bone density 10/2012 which showed osteopenia but stable if not improved from prior DEXA. Recommend repeating her bone density next year at two-year interval.  

## 2013-11-13 DIAGNOSIS — N890 Mild vaginal dysplasia: Secondary | ICD-10-CM

## 2013-11-13 HISTORY — DX: Mild vaginal dysplasia: N89.0

## 2013-11-18 ENCOUNTER — Encounter: Payer: Self-pay | Admitting: Gynecology

## 2013-11-18 NOTE — Telephone Encounter (Signed)
Left the below on pt voicemail, told her to call if questions.

## 2013-12-01 ENCOUNTER — Encounter: Payer: Self-pay | Admitting: Gynecology

## 2013-12-01 ENCOUNTER — Ambulatory Visit (INDEPENDENT_AMBULATORY_CARE_PROVIDER_SITE_OTHER): Payer: 59 | Admitting: Gynecology

## 2013-12-01 DIAGNOSIS — IMO0002 Reserved for concepts with insufficient information to code with codable children: Secondary | ICD-10-CM

## 2013-12-01 DIAGNOSIS — R6889 Other general symptoms and signs: Secondary | ICD-10-CM

## 2013-12-01 NOTE — Patient Instructions (Signed)
Office will call you with biopsy results 

## 2013-12-01 NOTE — Addendum Note (Signed)
Addended by: Dayna BarkerGARDNER, Antonyo Hinderer K on: 12/01/2013 03:02 PM   Modules accepted: Orders

## 2013-12-01 NOTE — Progress Notes (Signed)
Patient presents for colposcopy. Has history of AIS and HGSIL status post LAVH 2012. Pap smears have been normal since then. Most recent Pap smear shows ASCUS negative high risk HPV.  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 Bimanual without masses or tenderness.  Colposcopy after acetic acid cleanse shows small area of acetowhite change right upper mid vaginal cuff biopsied off. No other abdomen amount used visualized. Silver nitrate applied afterwards.  Assessment and plan: History of HGSIL and AIS. ASCUS Pap smear with small acetowhite change upper right mid vaginal cuff. Biopsy taken. Patient will follow up results. If normal or low-grade then plan expectant management. Otherwise triaged based upon results.

## 2013-12-05 ENCOUNTER — Encounter: Payer: Self-pay | Admitting: Gynecology

## 2013-12-10 ENCOUNTER — Encounter: Payer: Self-pay | Admitting: Gynecology

## 2013-12-12 ENCOUNTER — Other Ambulatory Visit: Payer: Self-pay

## 2014-04-20 ENCOUNTER — Telehealth: Payer: Self-pay | Admitting: *Deleted

## 2014-04-20 DIAGNOSIS — Z01419 Encounter for gynecological examination (general) (routine) without abnormal findings: Secondary | ICD-10-CM

## 2014-04-20 MED ORDER — ZOLPIDEM TARTRATE 10 MG PO TABS: 10.0000 mg | ORAL_TABLET | Freq: Every evening | ORAL | Status: DC | PRN

## 2014-04-20 NOTE — Telephone Encounter (Signed)
Okay for Ambien 10 mg #30 refill x4

## 2014-04-20 NOTE — Telephone Encounter (Signed)
Pt called requesting refill on Ambien 10 mg tablet. Please advise

## 2014-04-20 NOTE — Telephone Encounter (Signed)
rx called in, pt aware also

## 2014-04-26 ENCOUNTER — Telehealth: Payer: Self-pay | Admitting: *Deleted

## 2014-04-26 NOTE — Telephone Encounter (Signed)
Pt received recall letter in mail for fasting blood sugar pt wasn't sure what the lab was for.

## 2014-08-14 ENCOUNTER — Encounter: Payer: Self-pay | Admitting: Gynecology

## 2014-08-16 ENCOUNTER — Other Ambulatory Visit: Payer: Self-pay | Admitting: Gynecology

## 2014-09-25 ENCOUNTER — Encounter: Payer: Self-pay | Admitting: Gynecology

## 2014-10-26 ENCOUNTER — Other Ambulatory Visit (HOSPITAL_COMMUNITY)
Admission: RE | Admit: 2014-10-26 | Discharge: 2014-10-26 | Disposition: A | Discharge status: Home / Self Care | Payer: 59 | Source: Ambulatory Visit | Attending: Gynecology | Admitting: Gynecology

## 2014-10-26 ENCOUNTER — Encounter: Payer: Self-pay | Admitting: Gynecology

## 2014-10-26 ENCOUNTER — Ambulatory Visit (INDEPENDENT_AMBULATORY_CARE_PROVIDER_SITE_OTHER): Payer: 59 | Admitting: Gynecology

## 2014-10-26 VITALS — BP 124/80 | Ht 66.0 in | Wt 166.0 lb

## 2014-10-26 DIAGNOSIS — N893 Dysplasia of vagina, unspecified: Secondary | ICD-10-CM

## 2014-10-26 DIAGNOSIS — Z79890 Hormone replacement therapy (postmenopausal): Secondary | ICD-10-CM

## 2014-10-26 DIAGNOSIS — Z01419 Encounter for gynecological examination (general) (routine) without abnormal findings: Secondary | ICD-10-CM

## 2014-10-26 DIAGNOSIS — M8580 Other specified disorders of bone density and structure, unspecified site: Secondary | ICD-10-CM

## 2014-10-26 LAB — CBC WITH DIFFERENTIAL/PLATELET
Basophils Absolute: 0 10*3/uL (ref 0.0–0.1)
Basophils Relative: 0 % (ref 0–1)
Eosinophils Absolute: 0.4 10*3/uL (ref 0.0–0.7)
Eosinophils Relative: 3 % (ref 0–5)
HCT: 42.6 % (ref 36.0–46.0)
Hemoglobin: 14.6 g/dL (ref 12.0–15.0)
Lymphocytes Relative: 20 % (ref 12–46)
Lymphs Abs: 2.6 10*3/uL (ref 0.7–4.0)
MCH: 29.8 pg (ref 26.0–34.0)
MCHC: 34.3 g/dL (ref 30.0–36.0)
MCV: 86.9 fL (ref 78.0–100.0)
MPV: 8.9 fL (ref 8.6–12.4)
Monocytes Absolute: 0.8 10*3/uL (ref 0.1–1.0)
Monocytes Relative: 6 % (ref 3–12)
Neutro Abs: 9.2 10*3/uL — ABNORMAL HIGH (ref 1.7–7.7)
Neutrophils Relative %: 71 % (ref 43–77)
Platelets: 347 10*3/uL (ref 150–400)
RBC: 4.9 MIL/uL (ref 3.87–5.11)
RDW: 13.7 % (ref 11.5–15.5)
WBC: 13 10*3/uL — ABNORMAL HIGH (ref 4.0–10.5)

## 2014-10-26 MED ORDER — ESTRADIOL 0.075 MG/24HR TD PTTW: 1.0000 | MEDICATED_PATCH | TRANSDERMAL | Status: DC

## 2014-10-26 MED ORDER — ZOLPIDEM TARTRATE 10 MG PO TABS: 10.0000 mg | ORAL_TABLET | Freq: Every evening | ORAL | Status: DC | PRN

## 2014-10-26 NOTE — Patient Instructions (Signed)
You may obtain a copy of any labs that were done today by logging onto MyChart as outlined in the instructions provided with your AVS (after visit summary). The office will not call with normal lab results but certainly if there are any significant abnormalities then we will contact you.   Health Maintenance, Female A healthy lifestyle and preventative care can promote health and wellness.  Maintain regular health, dental, and eye exams.  Eat a healthy diet. Foods like vegetables, fruits, whole grains, low-fat dairy products, and lean protein foods contain the nutrients you need without too many calories. Decrease your intake of foods high in solid fats, added sugars, and salt. Get information about a proper diet from your caregiver, if necessary.  Regular physical exercise is one of the most important things you can do for your health. Most adults should get at least 150 minutes of moderate-intensity exercise (any activity that increases your heart rate and causes you to sweat) each week. In addition, most adults need muscle-strengthening exercises on 2 or more days a week.   Maintain a healthy weight. The body mass index (BMI) is a screening tool to identify possible weight problems. It provides an estimate of body fat based on height and weight. Your caregiver can help determine your BMI, and can help you achieve or maintain a healthy weight. For adults 20 years and older:  A BMI below 18.5 is considered underweight.  A BMI of 18.5 to 24.9 is normal.  A BMI of 25 to 29.9 is considered overweight.  A BMI of 30 and above is considered obese.  Maintain normal blood lipids and cholesterol by exercising and minimizing your intake of saturated fat. Eat a balanced diet with plenty of fruits and vegetables. Blood tests for lipids and cholesterol should begin at age 61 and be repeated every 5 years. If your lipid or cholesterol levels are high, you are over 50, or you are a high risk for heart  disease, you may need your cholesterol levels checked more frequently.Ongoing high lipid and cholesterol levels should be treated with medicines if diet and exercise are not effective.  If you smoke, find out from your caregiver how to quit. If you do not use tobacco, do not start.  Lung cancer screening is recommended for adults aged 33 80 years who are at high risk for developing lung cancer because of a history of smoking. Yearly low-dose computed tomography (CT) is recommended for people who have at least a 30-pack-year history of smoking and are a current smoker or have quit within the past 15 years. A pack year of smoking is smoking an average of 1 pack of cigarettes a day for 1 year (for example: 1 pack a day for 30 years or 2 packs a day for 15 years). Yearly screening should continue until the smoker has stopped smoking for at least 15 years. Yearly screening should also be stopped for people who develop a health problem that would prevent them from having lung cancer treatment.  If you are pregnant, do not drink alcohol. If you are breastfeeding, be very cautious about drinking alcohol. If you are not pregnant and choose to drink alcohol, do not exceed 1 drink per day. One drink is considered to be 12 ounces (355 mL) of beer, 5 ounces (148 mL) of wine, or 1.5 ounces (44 mL) of liquor.  Avoid use of street drugs. Do not share needles with anyone. Ask for help if you need support or instructions about stopping  the use of drugs.  High blood pressure causes heart disease and increases the risk of stroke. Blood pressure should be checked at least every 1 to 2 years. Ongoing high blood pressure should be treated with medicines, if weight loss and exercise are not effective.  If you are 59 to 54 years old, ask your caregiver if you should take aspirin to prevent strokes.  Diabetes screening involves taking a blood sample to check your fasting blood sugar level. This should be done once every 3  years, after age 91, if you are within normal weight and without risk factors for diabetes. Testing should be considered at a younger age or be carried out more frequently if you are overweight and have at least 1 risk factor for diabetes.  Breast cancer screening is essential preventative care for women. You should practice "breast self-awareness." This means understanding the normal appearance and feel of your breasts and may include breast self-examination. Any changes detected, no matter how small, should be reported to a caregiver. Women in their 66s and 30s should have a clinical breast exam (CBE) by a caregiver as part of a regular health exam every 1 to 3 years. After age 101, women should have a CBE every year. Starting at age 100, women should consider having a mammogram (breast X-ray) every year. Women who have a family history of breast cancer should talk to their caregiver about genetic screening. Women at a high risk of breast cancer should talk to their caregiver about having an MRI and a mammogram every year.  Breast cancer gene (BRCA)-related cancer risk assessment is recommended for women who have family members with BRCA-related cancers. BRCA-related cancers include breast, ovarian, tubal, and peritoneal cancers. Having family members with these cancers may be associated with an increased risk for harmful changes (mutations) in the breast cancer genes BRCA1 and BRCA2. Results of the assessment will determine the need for genetic counseling and BRCA1 and BRCA2 testing.  The Pap test is a screening test for cervical cancer. Women should have a Pap test starting at age 57. Between ages 25 and 35, Pap tests should be repeated every 2 years. Beginning at age 37, you should have a Pap test every 3 years as long as the past 3 Pap tests have been normal. If you had a hysterectomy for a problem that was not cancer or a condition that could lead to cancer, then you no longer need Pap tests. If you are  between ages 50 and 76, and you have had normal Pap tests going back 10 years, you no longer need Pap tests. If you have had past treatment for cervical cancer or a condition that could lead to cancer, you need Pap tests and screening for cancer for at least 20 years after your treatment. If Pap tests have been discontinued, risk factors (such as a new sexual partner) need to be reassessed to determine if screening should be resumed. Some women have medical problems that increase the chance of getting cervical cancer. In these cases, your caregiver may recommend more frequent screening and Pap tests.  The human papillomavirus (HPV) test is an additional test that may be used for cervical cancer screening. The HPV test looks for the virus that can cause the cell changes on the cervix. The cells collected during the Pap test can be tested for HPV. The HPV test could be used to screen women aged 44 years and older, and should be used in women of any age  who have unclear Pap test results. After the age of 55, women should have HPV testing at the same frequency as a Pap test.  Colorectal cancer can be detected and often prevented. Most routine colorectal cancer screening begins at the age of 44 and continues through age 20. However, your caregiver may recommend screening at an earlier age if you have risk factors for colon cancer. On a yearly basis, your caregiver may provide home test kits to check for hidden blood in the stool. Use of a small camera at the end of a tube, to directly examine the colon (sigmoidoscopy or colonoscopy), can detect the earliest forms of colorectal cancer. Talk to your caregiver about this at age 86, when routine screening begins. Direct examination of the colon should be repeated every 5 to 10 years through age 13, unless early forms of pre-cancerous polyps or small growths are found.  Hepatitis C blood testing is recommended for all people born from [birth year] through 1965 and any  individual with known risks for hepatitis C.  Practice safe sex. Use condoms and avoid high-risk sexual practices to reduce the spread of sexually transmitted infections (STIs). Sexually active women aged 36 and younger should be checked for Chlamydia, which is a common sexually transmitted infection. Older women with new or multiple partners should also be tested for Chlamydia. Testing for other STIs is recommended if you are sexually active and at increased risk.  Osteoporosis is a disease in which the bones lose minerals and strength with aging. This can result in serious bone fractures. The risk of osteoporosis can be identified using a bone density scan. Women ages 20 and over and women at risk for fractures or osteoporosis should discuss screening with their caregivers. Ask your caregiver whether you should be taking a calcium supplement or vitamin D to reduce the rate of osteoporosis.  Menopause can be associated with physical symptoms and risks. Hormone replacement therapy is available to decrease symptoms and risks. You should talk to your caregiver about whether hormone replacement therapy is right for you.  Use sunscreen. Apply sunscreen liberally and repeatedly throughout the day. You should seek shade when your shadow is shorter than you. Protect yourself by wearing long sleeves, pants, a wide-brimmed hat, and sunglasses year round, whenever you are outdoors.  Notify your caregiver of new moles or changes in moles, especially if there is a change in shape or color. Also notify your caregiver if a mole is larger than the size of a pencil eraser.  Stay current with your immunizations. Document Released: 04/14/2011 Document Revised: 01/24/2013 Document Reviewed: 04/14/2011 [facility] Patient Information 2014 [employer].

## 2014-10-26 NOTE — Progress Notes (Signed)
Christina Lloyd 1961-05-10 [id number]        54 y.o.  G1P0010 for annual exam.  Several issues noted below.  Past medical history,surgical history, problem list, medications, allergies, family history and social history were all reviewed and documented as reviewed in the EPIC chart.  ROS:  Performed with pertinent positives and negatives included in the history, assessment and plan.   Additional significant findings :  none   Exam: [facility] personassistant Filed Vitals:   10/26/14 1403  BP: 124/80  Height: 5\' 6"  (1.676 m)  Weight: 166 lb (75.297 kg)   General appearance:  Normal affect, orientation and appearance. Skin: Grossly normal HEENT: Without gross lesions.  No cervical or supraclavicular adenopathy. Thyroid normal.  Lungs:  Clear without wheezing, rales or rhonchi Cardiac: RR, without RMG Abdominal:  Soft, nontender, without masses, guarding, rebound, organomegaly or hernia Breasts:  Examined lying and sitting without masses, retractions, discharge or axillary adenopathy. Pelvic:  Ext/BUS/vagina normal. Pap of cuff done  Adnexa  Without masses or tenderness    Anus and perineum  Normal   Rectovaginal  Normal sphincter tone without palpated masses or tenderness.    Assessment/Plan:  54 y.o. 581P0010 female for annual exam.   1. Postmenopausal/HRT. Status post LAVH for AIS/HGSIL 2012. Is on Vivelle 0.075 mg patch. Was increased last year due to worsening menopausal symptoms. Is doing well now without significant hot flashes or night sweats. She desires to continue.  I again reviewed the whole issue of HRT with her to include the WHI study with increased risk of stroke, heart attack, DVT and breast cancer. The ACOG and NAMS statements for lowest dose for the shortest period of time reviewed. Transdermal versus oral first-pass effect benefit discussed.  Patient understands and accepts I refilled her 1 year. 2. VAIN 1.  History of ASCUS negative high risk HPV 11/2013.  LAVH 2012 for  AIS/HGSIL. 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  Pap smear of vaginal cuff done today. Patient will follow up for results. 3. Osteopenia.  DEXA 10/2012 T score -1.5.  Statistically significant increase in bone density compared to 2009. Had transiently been on a bisphosphonate years ago. We'll plan repeat DEXA in another year or 2 areas increased calcium vitamin D reviewed. Check vitamin D level today. 4. Complaints of some generalized mild arthralgias. Suspect secondary to age. She did ask by would run a sedimentation rate. 5. Mammography 11/2013. Patient reminded to schedule next month. SBE monthly reviewed. 6. Colonoscopy 2013. Repeat at their recommended interval. 7. Health maintenance. Baseline CBC and comprehensive metabolic panel lipid profile urinalysis hemoglobin A1c TSH vitamin D urinalysis ordered.  Follow up in one year, sooner as needed.     Dara LordsFONTAINE,Christina Lloyd P MD, 2:32 PM 10/26/2014

## 2014-10-26 NOTE — Addendum Note (Signed)
Addended by: Dayna BarkerGARDNER, KIMBERLY K on: 10/26/2014 03:00 PM   Modules accepted: Orders, SmartSet

## 2014-10-27 ENCOUNTER — Other Ambulatory Visit: Payer: Self-pay | Admitting: Gynecology

## 2014-10-27 DIAGNOSIS — E559 Vitamin D deficiency, unspecified: Secondary | ICD-10-CM

## 2014-10-27 LAB — URINALYSIS W MICROSCOPIC + REFLEX CULTURE
Bacteria, UA: NONE SEEN
Bilirubin Urine: NEGATIVE
Casts: NONE SEEN
Crystals: NONE SEEN
Glucose, UA: NEGATIVE mg/dL
Hgb urine dipstick: NEGATIVE
Ketones, ur: NEGATIVE mg/dL
Leukocytes, UA: NEGATIVE
Nitrite: NEGATIVE
Protein, ur: NEGATIVE mg/dL
Specific Gravity, Urine: 1.027 (ref 1.005–1.030)
Squamous Epithelial / LPF: NONE SEEN
Urobilinogen, UA: 0.2 mg/dL (ref 0.0–1.0)
pH: 5.5 (ref 5.0–8.0)

## 2014-10-27 LAB — COMPREHENSIVE METABOLIC PANEL
ALT: 12 U/L (ref 0–35)
AST: 15 U/L (ref 0–37)
Albumin: 4.3 g/dL (ref 3.5–5.2)
Alkaline Phosphatase: 103 U/L (ref 39–117)
BUN: 14 mg/dL (ref 6–23)
CO2: 24 mEq/L (ref 19–32)
Calcium: 9.5 mg/dL (ref 8.4–10.5)
Chloride: 105 mEq/L (ref 96–112)
Creat: 0.62 mg/dL (ref 0.50–1.10)
Glucose, Bld: 85 mg/dL (ref 70–99)
Potassium: 3.8 mEq/L (ref 3.5–5.3)
Sodium: 140 mEq/L (ref 135–145)
Total Bilirubin: 0.4 mg/dL (ref 0.2–1.2)
Total Protein: 6.5 g/dL (ref 6.0–8.3)

## 2014-10-27 LAB — LIPID PANEL
Cholesterol: 191 mg/dL (ref 0–200)
HDL: 55 mg/dL (ref >39)
LDL Cholesterol: 98 mg/dL (ref 0–99)
Total CHOL/HDL Ratio: 3.5 Ratio
Triglycerides: 188 mg/dL — ABNORMAL HIGH (ref <150)
VLDL: 38 mg/dL (ref 0–40)

## 2014-10-27 LAB — TSH: TSH: 0.42 u[IU]/mL (ref 0.350–4.500)

## 2014-10-27 LAB — HEMOGLOBIN A1C
Hgb A1c MFr Bld: 6 % — ABNORMAL HIGH (ref <5.7)
Mean Plasma Glucose: 126 mg/dL — ABNORMAL HIGH (ref <117)

## 2014-10-27 LAB — SEDIMENTATION RATE: Sed Rate: 4 mm/hr (ref 0–22)

## 2014-10-27 LAB — VITAMIN D 25 HYDROXY (VIT D DEFICIENCY, FRACTURES): Vit D, 25-Hydroxy: 28 ng/mL — ABNORMAL LOW (ref 30–100)

## 2014-10-31 LAB — CYTOLOGY - PAP

## 2014-11-02 ENCOUNTER — Other Ambulatory Visit: Payer: Self-pay

## 2014-12-20 ENCOUNTER — Other Ambulatory Visit: Payer: Self-pay | Admitting: Gynecology

## 2015-01-12 ENCOUNTER — Encounter: Payer: Self-pay | Admitting: Gynecology

## 2015-04-26 ENCOUNTER — Ambulatory Visit (INDEPENDENT_AMBULATORY_CARE_PROVIDER_SITE_OTHER): Payer: 59 | Admitting: Gynecology

## 2015-04-26 ENCOUNTER — Encounter: Payer: Self-pay | Admitting: Gynecology

## 2015-04-26 VITALS — BP 120/76 | Temp 99.7°F

## 2015-04-26 DIAGNOSIS — B349 Viral infection, unspecified: Secondary | ICD-10-CM | POA: Diagnosis not present

## 2015-04-26 DIAGNOSIS — R350 Frequency of micturition: Secondary | ICD-10-CM | POA: Diagnosis not present

## 2015-04-26 LAB — URINALYSIS W MICROSCOPIC + REFLEX CULTURE
Bilirubin Urine: NEGATIVE
Glucose, UA: NEGATIVE mg/dL
Hgb urine dipstick: NEGATIVE
Ketones, ur: NEGATIVE mg/dL
Leukocytes, UA: NEGATIVE
Nitrite: NEGATIVE
Protein, ur: NEGATIVE mg/dL
Specific Gravity, Urine: 1.01 (ref 1.005–1.030)
Urobilinogen, UA: 0.2 mg/dL (ref 0.0–1.0)
pH: 7 (ref 5.0–8.0)

## 2015-04-26 NOTE — Addendum Note (Signed)
Addended by: Dayna BarkerGARDNER, Shamyah Stantz K on: 04/26/2015 04:28 PM   Modules accepted: Orders

## 2015-04-26 NOTE — Progress Notes (Addendum)
Christina ShearsKaren L Lloyd Jan 11, 1961 [id number]        54 y.o.  G1P0010 presents complaining of several days of feeling achy all over. No nausea vomiting diarrhea constipation. Low-grade fevers at home particularly at night. No cough sputum or UTI symptoms such as frequency dysuria urgency. No GYN complaints such as vaginitis discharge odor.  No other family members sick. Patient thinks she is probably coming down with a virus wanted to rule out UTI because she is had an asymptomatic UTI in the past causing fevers.  Past medical history,surgical history, problem list, medications, allergies, family history and social history were all reviewed and documented in the EPIC chart.  Directed ROS with pertinent positives and negatives documented in the history of present illness/assessment and plan.  Exam: Filed Vitals:   04/26/15 1515  BP: 120/76  Temp: 99.7 F (37.6 C)  TempSrc: Oral   General appearance:  Normal HEENT normal without evidence of cervical adenopathy or pharyngitis. Lungs clear Cardiac regular rate no rubs murmurs or gallops Abdomen soft nontender without masses guarding rebound Spine straight without CVA tenderness  Urine analysis negative   Assessment/Plan:  54 y.o. G1P0010 with probable early viral infection. Recommend OTC nonsteroidal anti-inflammatory, fluids and rest. Follow up if localizing symptoms or anything else presents.    Dara LordsFONTAINE,TIMOTHY P MD, 3:29 PM 04/26/2015

## 2015-04-26 NOTE — Patient Instructions (Signed)
Follow up if any issues

## 2015-05-09 ENCOUNTER — Other Ambulatory Visit: Payer: Self-pay | Admitting: Gynecology

## 2015-05-09 NOTE — Telephone Encounter (Signed)
Called into pharmacy

## 2015-11-01 ENCOUNTER — Other Ambulatory Visit (HOSPITAL_COMMUNITY)
Admission: RE | Admit: 2015-11-01 | Discharge: 2015-11-01 | Disposition: A | Discharge status: Home / Self Care | Payer: 59 | Source: Ambulatory Visit | Attending: Gynecology | Admitting: Gynecology

## 2015-11-01 ENCOUNTER — Ambulatory Visit (INDEPENDENT_AMBULATORY_CARE_PROVIDER_SITE_OTHER): Payer: 59 | Admitting: Gynecology

## 2015-11-01 ENCOUNTER — Encounter: Payer: Self-pay | Admitting: Gynecology

## 2015-11-01 VITALS — BP 124/80 | Ht 66.0 in | Wt 170.0 lb

## 2015-11-01 DIAGNOSIS — Z1321 Encounter for screening for nutritional disorder: Secondary | ICD-10-CM

## 2015-11-01 DIAGNOSIS — Z01419 Encounter for gynecological examination (general) (routine) without abnormal findings: Secondary | ICD-10-CM

## 2015-11-01 DIAGNOSIS — N893 Dysplasia of vagina, unspecified: Secondary | ICD-10-CM

## 2015-11-01 DIAGNOSIS — Z13220 Encounter for screening for lipoid disorders: Secondary | ICD-10-CM

## 2015-11-01 DIAGNOSIS — Z1329 Encounter for screening for other suspected endocrine disorder: Secondary | ICD-10-CM | POA: Diagnosis not present

## 2015-11-01 DIAGNOSIS — Z79890 Hormone replacement therapy (postmenopausal): Secondary | ICD-10-CM

## 2015-11-01 LAB — CBC WITH DIFFERENTIAL/PLATELET
Basophils Absolute: 0 10*3/uL (ref 0.0–0.1)
Basophils Relative: 0 % (ref 0–1)
Eosinophils Absolute: 0.3 10*3/uL (ref 0.0–0.7)
Eosinophils Relative: 3 % (ref 0–5)
HCT: 41.8 % (ref 36.0–46.0)
Hemoglobin: 14.3 g/dL (ref 12.0–15.0)
Lymphocytes Relative: 24 % (ref 12–46)
Lymphs Abs: 2.4 10*3/uL (ref 0.7–4.0)
MCH: 29.1 pg (ref 26.0–34.0)
MCHC: 34.2 g/dL (ref 30.0–36.0)
MCV: 85 fL (ref 78.0–100.0)
MPV: 8.8 fL (ref 8.6–12.4)
Monocytes Absolute: 0.7 10*3/uL (ref 0.1–1.0)
Monocytes Relative: 7 % (ref 3–12)
Neutro Abs: 6.7 10*3/uL (ref 1.7–7.7)
Neutrophils Relative %: 66 % (ref 43–77)
Platelets: 350 10*3/uL (ref 150–400)
RBC: 4.92 MIL/uL (ref 3.87–5.11)
RDW: 13.2 % (ref 11.5–15.5)
WBC: 10.1 10*3/uL (ref 4.0–10.5)

## 2015-11-01 LAB — LIPID PANEL
Cholesterol: 183 mg/dL (ref 125–200)
HDL: 42 mg/dL — ABNORMAL LOW (ref >=46)
LDL Cholesterol: 112 mg/dL (ref <130)
Total CHOL/HDL Ratio: 4.4 Ratio (ref <=5.0)
Triglycerides: 146 mg/dL (ref <150)
VLDL: 29 mg/dL (ref <30)

## 2015-11-01 LAB — COMPREHENSIVE METABOLIC PANEL
ALT: 10 U/L (ref 6–29)
AST: 13 U/L (ref 10–35)
Albumin: 4.3 g/dL (ref 3.6–5.1)
Alkaline Phosphatase: 92 U/L (ref 33–130)
BUN: 13 mg/dL (ref 7–25)
CO2: 22 mmol/L (ref 20–31)
Calcium: 9.6 mg/dL (ref 8.6–10.4)
Chloride: 106 mmol/L (ref 98–110)
Creat: 0.56 mg/dL (ref 0.50–1.05)
Glucose, Bld: 113 mg/dL — ABNORMAL HIGH (ref 65–99)
Potassium: 3.7 mmol/L (ref 3.5–5.3)
Sodium: 142 mmol/L (ref 135–146)
Total Bilirubin: 0.3 mg/dL (ref 0.2–1.2)
Total Protein: 6.5 g/dL (ref 6.1–8.1)

## 2015-11-01 MED ORDER — ESTRADIOL 0.075 MG/24HR TD PTTW: MEDICATED_PATCH | TRANSDERMAL | Status: DC

## 2015-11-01 MED ORDER — ZOLPIDEM TARTRATE 10 MG PO TABS: 10.0000 mg | ORAL_TABLET | Freq: Every evening | ORAL | Status: DC | PRN

## 2015-11-01 NOTE — Progress Notes (Signed)
Christina Lloyd 07/14/1961 [id number]        55 y.o.  G1P0010  for annual exam.  Doing well. Several issues noted below.  Past medical history,surgical history, problem list, medications, allergies, family history and social history were all reviewed and documented as reviewed in the EPIC chart.  ROS:  Performed with pertinent positives and negatives included in the history, assessment and plan.   Additional significant findings :  none   Exam: [name] assistant Filed Vitals:   11/01/15 1400  BP: 124/80  Height:  (1.676 m)  Weight: 170 lb (77.111 kg)   General appearance:  Normal affect, orientation and appearance. Skin: Grossly normal HEENT: Without gross lesions.  No cervical or supraclavicular adenopathy. Thyroid normal.  Lungs:  Clear without wheezing, rales or rhonchi Cardiac: RR, without RMG Abdominal:  Soft, nontender, without masses, guarding, rebound, organomegaly or hernia Breasts:  Examined lying and sitting without masses, retractions, discharge or axillary adenopathy. Pelvic:  Ext/BUS/vagina normal with mild atrophic changes  Adnexa  Without masses or tenderness    Anus and perineum  Normal   Rectovaginal  Normal sphincter tone without palpated masses or tenderness.    Assessment/Plan:  55 y.o. G32P0010 female for annual exam.   1. Postmenopausal/atrophic genital changes/HRT. 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 On Vivelle 0.075 patches doing well and wants to continue. I again reviewed the issues and risks to include stroke heart attack DVT breast cancer. Patient's comfortable continuing and I refilled 1 year. 2. Pap smear 2016. Pap smear done today. History of high-grade dysplasia with adenocarcinoma in situ. Subsequent VAIN 1 2015 with normal follow up Pap smear last year. 3. Osteopenia. DEXA 2014 T score -1.5 with a statistically significant increase in bone density from prior DEXA 2009. Office to repeat now or wait 1  more year discussed and patient prefers to wait 1 more year. History of low vitamin D before. Check vitamin D level today. 4. Mammography 12/2014. Continue with annual mammography when due. SBE monthly reviewed. 5. Colonoscopy 2013. Repeated their recommended interval. 6. Sleep disturbance. 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 Does well with this.  Ambien 10 mg #30 with 5 refills provided. 7. Health maintenance. Patient requests routine lab work included a screening hepatitis C. CBC comprehensive metabolic panel lipid profile TSH vitamin D urinalysis ordered. Follow up in one year, sooner as needed.   [name] MD, 2:22 PM 11/01/2015

## 2015-11-01 NOTE — Patient Instructions (Signed)

## 2015-11-01 NOTE — Addendum Note (Signed)
Addended by: Dayna Barker on: 11/01/2015 03:23 PM   Modules accepted: Orders

## 2015-11-02 ENCOUNTER — Other Ambulatory Visit: Payer: Self-pay | Admitting: Gynecology

## 2015-11-02 DIAGNOSIS — E7800 Pure hypercholesterolemia, unspecified: Secondary | ICD-10-CM

## 2015-11-02 DIAGNOSIS — R7989 Other specified abnormal findings of blood chemistry: Secondary | ICD-10-CM

## 2015-11-02 DIAGNOSIS — E559 Vitamin D deficiency, unspecified: Secondary | ICD-10-CM

## 2015-11-02 DIAGNOSIS — R739 Hyperglycemia, unspecified: Secondary | ICD-10-CM

## 2015-11-02 LAB — VITAMIN D 25 HYDROXY (VIT D DEFICIENCY, FRACTURES): Vit D, 25-Hydroxy: 28 ng/mL — ABNORMAL LOW (ref 30–100)

## 2015-11-02 LAB — URINALYSIS W MICROSCOPIC + REFLEX CULTURE
Bacteria, UA: NONE SEEN [HPF]
Bilirubin Urine: NEGATIVE
Casts: NONE SEEN [LPF]
Glucose, UA: NEGATIVE
Hgb urine dipstick: NEGATIVE
Ketones, ur: NEGATIVE
Leukocytes, UA: NEGATIVE
Nitrite: NEGATIVE
Protein, ur: NEGATIVE
RBC / HPF: NONE SEEN RBC/HPF (ref <=2)
Specific Gravity, Urine: 1.025 (ref 1.001–1.035)
Squamous Epithelial / LPF: NONE SEEN [HPF] (ref <=5)
WBC, UA: NONE SEEN WBC/HPF (ref <=5)
Yeast: NONE SEEN [HPF]
pH: 5 (ref 5.0–8.0)

## 2015-11-02 LAB — TSH: TSH: 0.287 u[IU]/mL — ABNORMAL LOW (ref 0.350–4.500)

## 2015-11-02 LAB — HEPATITIS C ANTIBODY: HCV Ab: NEGATIVE

## 2015-11-05 LAB — CYTOLOGY - PAP

## 2015-11-08 ENCOUNTER — Other Ambulatory Visit: Payer: 59

## 2015-11-08 DIAGNOSIS — E7800 Pure hypercholesterolemia, unspecified: Secondary | ICD-10-CM

## 2015-11-08 DIAGNOSIS — R946 Abnormal results of thyroid function studies: Secondary | ICD-10-CM | POA: Diagnosis not present

## 2015-11-08 DIAGNOSIS — R7989 Other specified abnormal findings of blood chemistry: Secondary | ICD-10-CM

## 2015-11-08 DIAGNOSIS — R7309 Other abnormal glucose: Secondary | ICD-10-CM | POA: Diagnosis not present

## 2015-11-08 DIAGNOSIS — R739 Hyperglycemia, unspecified: Secondary | ICD-10-CM

## 2015-11-08 LAB — LIPID PANEL
Cholesterol: 161 mg/dL (ref 125–200)
HDL: 43 mg/dL — ABNORMAL LOW (ref >=46)
LDL Cholesterol: 91 mg/dL (ref <130)
Total CHOL/HDL Ratio: 3.7 Ratio (ref <=5.0)
Triglycerides: 133 mg/dL (ref <150)
VLDL: 27 mg/dL (ref <30)

## 2015-11-08 LAB — THYROID PANEL WITH TSH
Free Thyroxine Index: 2.4 (ref 1.4–3.8)
T3 Uptake: 31 % (ref 22–35)
T4, Total: 7.9 ug/dL (ref 4.5–12.0)
TSH: 0.205 u[IU]/mL — ABNORMAL LOW (ref 0.350–4.500)

## 2015-11-08 LAB — HEMOGLOBIN A1C
Hgb A1c MFr Bld: 5.8 % — ABNORMAL HIGH (ref <5.7)
Mean Plasma Glucose: 120 mg/dL — ABNORMAL HIGH (ref <117)

## 2015-11-09 LAB — GLUCOSE, FASTING: Glucose, Fasting: 87 mg/dL (ref 65–99)

## 2015-11-14 ENCOUNTER — Telehealth: Payer: Self-pay | Admitting: *Deleted

## 2015-11-14 DIAGNOSIS — R7989 Other specified abnormal findings of blood chemistry: Secondary | ICD-10-CM

## 2015-11-14 NOTE — Telephone Encounter (Signed)
-----   Message from Keenan Bachelor, [place] sent at 11/14/2015  4:38 PM EST ----- Regarding: referral to [facility] her TSH remains low. Would recommend appointment to see endocrinologist for evaluation. Recommend appointment with Dr. Elvera Lennox.  Patient is night nurse. She will be available in afternoon the next two days if you need to call her. Thanks!!

## 2015-11-14 NOTE — Telephone Encounter (Signed)
Referral placed they will contact pt to schedule. 

## 2015-11-15 NOTE — Telephone Encounter (Signed)
Pt called back and said referral to [facility]

## 2015-11-16 NOTE — Telephone Encounter (Signed)
Pt would prefer to see provider at [facility], I spoke with Roanna Raider and pt can see Dr.Tisovec, I will fax notes for him to review and Roanna Raider will contact pt to schedule and let me know as well.

## 2015-11-16 NOTE — Telephone Encounter (Signed)
I called [facility] and was informed by the referral coordinator that the requested physicians Dr.Perrini, [place], [place] are not accepting new patients. She also informed me that notes would need to be faxed for approval. I called pt to speak with her about this and told her to call me back.

## 2015-11-22 ENCOUNTER — Encounter: Payer: Self-pay | Admitting: Gynecology

## 2015-11-23 NOTE — Telephone Encounter (Signed)
Dr. Velvet BatheVictorino Dike did request referral with Endocrinologist at [facility] per patient request. We can figure out that confusion.  Please see patients question about not doing this and advise me what to tell her.

## 2015-11-23 NOTE — Telephone Encounter (Signed)
Pt sent e-mail stating Dr.Tisovec office is months before she can get in would like to proceed with [facility]. Referral placed they will contact pt to schedule.

## 2015-11-23 NOTE — Telephone Encounter (Signed)
I spoke with patient and informed her of Dr. Kristie Cowman note below. Patient said she had given Victorino Dike names of who she would like to see and one was a PCP and she did not realize that and that is what created the confusion.  She said she cannot get in at [facility] "for forever". She wants to go ahead and get referral to Dr. Elvera Lennox.  I told her I will let Victorino Dike know.

## 2015-11-23 NOTE — Telephone Encounter (Signed)
It sounds like she is doing all the right things. I do recommend seeing an endocrinologist because of the low TSH. This could be an indication of very early thyroiditis and the issue is how to follow this and if anything needs to be done at this point as far as treatment. That is why I want their expertise and input.

## 2015-11-30 NOTE — Telephone Encounter (Signed)
I called Dr.Gherge office to confirm they have pt referral, they did will have Dr,.Ghegere review chart for approval , once approved they will call pt to schedule.

## 2015-12-06 NOTE — Telephone Encounter (Signed)
Appointment on 12/28/15 @ 1:15pm

## 2015-12-12 MED FILL — ESTRADIOL 0.075 MG PATCH: 0.075 | 84 days supply | Qty: 24 | Fill #0

## 2015-12-28 ENCOUNTER — Ambulatory Visit (INDEPENDENT_AMBULATORY_CARE_PROVIDER_SITE_OTHER): Payer: 59 | Admitting: Internal Medicine

## 2015-12-28 ENCOUNTER — Encounter: Payer: Self-pay | Admitting: Internal Medicine

## 2015-12-28 VITALS — BP 136/86 | HR 112 | Temp 97.9°F | Ht 66.0 in | Wt 169.0 lb

## 2015-12-28 DIAGNOSIS — E0590 Thyrotoxicosis, unspecified without thyrotoxic crisis or storm: Secondary | ICD-10-CM | POA: Diagnosis not present

## 2015-12-28 NOTE — Patient Instructions (Addendum)
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  Please stop at the lab.  I will let you know if we need to get the Thyroid Uptake and Scan or only repeat of the thyroid tests in 1.5 months.  Please come back for a follow-up appointment in 4 months.

## 2015-12-28 NOTE — Progress Notes (Signed)
Patient ID: Christina Lloyd, female   DOB: 03/03/1961, 55 y.o.   MRN: [id number]   HPI  Christina Lloyd is a 55 y.o.-year-old female, referred by Dr Audie Box, in consultation for subclinical thyrotoxicosis.  Pt tells me she has had low normal TSH levels over the years. Sister also has low normal TSH levels.  I reviewed pt's latest thyroid tests - normal TT4 and low TSH: Component     Latest Ref Rng 11/08/2015  Thyroxine (T4)     4.5 - 12.0 ug/dL 7.9  T3 Uptake     22 - 35 % 31  Free Thyroxine Index     1.4 - 3.8 2.4  TSH     0.350 - 4.500 uIU/mL 0.205 (L)   Lab Results  Component Value Date   TSH 0.205* 11/08/2015   TSH 0.287* 11/01/2015   TSH 0.420 10/26/2014   TSH 0.520 10/25/2013    Pt denies feeling nodules in neck, hoarseness, dysphagia/odynophagia, SOB with lying down.  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 No FH of thyroid cancer. No h/o radiation tx to head or neck.  No seaweed or kelp, no recent contrast studies. No steroid use. No herbal supplements. + Biotin use - last dose 24h ago (B vitamins).  I reviewed her chart and she also has a history of osteopenia - h/o fractures in feet, wrist.  Her brother has had a partial thyroidectomy >> growing goiter >> he is on Armour. No thyroid cancer hx.   FH of psoriasis.  ROS: Constitutional: see HPI Eyes: no blurry vision, no xerophthalmia ENT: no sore throat, no nodules palpated in throat, no dysphagia/odynophagia, no hoarseness, + decreased hearing Cardiovascular: no CP/SOB/+ palpitations/no leg swelling Respiratory: no cough/SOB Gastrointestinal: no N/V/D/C Musculoskeletal: no muscle/+ joint aches Skin: + rash - rosacea on  face Neurological: + slight tremors/no numbness/tingling/dizziness Psychiatric: no depression/anxiety  Past Medical History  Diagnosis Date  . PVC (premature ventricular contraction)   . History of shingles   . Proctitis   . Adenocarcinoma in situ (AIS) of uterine cervix 2012  . Osteopenia 10/2012    T score -1.5. Statistically significant increased bone mineral density of lumbar spine compared to 2009  . 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 Blepharitis    Past Surgical History  Procedure Laterality Date  . Gallbladder surgery    . Pilonidal cyst excision    . Lavh  2012    AIS, HGSIL  . Vaginal hysterectomy     Social History   Social History  . Marital Status: Single    Spouse Name: N/A  . 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 Smoking status: Former Smoker, quit in 2016  . Smokeless tobacco: Not on file  . Alcohol Use: 0.0 oz/week    0 Standard drinks or equivalent per week     Comment: occasionally  . Drug Use: No   Current Outpatient Prescriptions on File Prior to Visit  Medication Sig Dispense Refill  .  Acetaminophen (TYLENOL PO) Take by mouth.    . Calcium Carbonate-Vitamin D (CALCIUM + D PO) Take by mouth. Reported on 11/01/2015    . estradiol (VIVELLE-DOT) 0.075 MG/24HR APPLY 1 PATCH TO SKIN TWICE PER WEEK 24 patch 4  . Ivermectin (SOOLANTRA) 1 % CREA Apply topically.    . Tavaborole (KERYDIN) 5 % SOLN Apply topically.    Marland Kitchen zolpidem (AMBIEN) 10 MG tablet Take 1 tablet (10 mg total) by mouth at bedtime as needed. for sleep 30 tablet 5   No current facility-administered medications on file prior to visit.   Allergies  Allergen Reactions  . Penicillins Other (See Comments)    Childhood reaction  . Nsaids Other (See Comments)    Patient states she avoids NSAIDs as they call her proctitis to flare and hemorrhoids to bleed.    Family History  Problem Relation Age of Onset  . Heart disease Mother   . Alzheimer's disease Mother   . Heart disease Father   . Diabetes Father   . Heart disease Maternal Grandmother   . Diabetes Maternal Grandmother   . Heart disease Maternal Grandfather   . Diabetes Maternal Grandfather   . Stroke Paternal Grandmother   . Breast cancer Paternal Grandmother 51  . Stroke Paternal Grandfather   . Thyroid disease Brother    PE: BP 136/86 mmHg  Pulse 112  Temp(Src) 97.9 F (36.6 C) (Oral)  Ht  (1.676 m)  Wt 169 lb (76.658 kg)  BMI 27.29 kg/m2  SpO2 98% Wt Readings from Last 3 Encounters:  12/28/15 169 lb (76.658 kg)  11/01/15 170 lb (77.111 kg)  10/26/14 166 lb (75.297 kg)   Constitutional: overweight, in NAD Eyes: PERRLA, EOMI, no exophthalmos, no lid lag, no stare ENT: moist mucous membranes, + slight symmetric thyromegaly, no thyroid bruits, no cervical lymphadenopathy Cardiovascular: tachycardia, RR, No MRG Respiratory: CTA B Gastrointestinal: abdomen soft, NT, ND, BS+ Musculoskeletal: no deformities, strength intact in all 4 Skin: moist, warm, no rashes Neurological: no tremor with outstretched hands, DTR normal in all 4  ASSESSMENT: 1. Subclinical Thyrotoxicosis  PLAN:  1. Patient with a recently found low TSH x2, with some possible thyrotoxic sxs: she has heat intolerance, but not new - after menopause, + palpitations - since the 1990s, + tachycardic today, no hyperdefecation, no anxietyno weight loss.  - she does not appear to have exogenous causes for the low TSH.  - We discussed that possible causes of thyrotoxicosis are:  Graves ds   Thyroiditis toxic multinodular goiter/ toxic adenoma (I cannot feel nodules at palpation of her thyroid). - I suggested that we check the TSH, fT3 and fT4 and also add thyroid stimulating antibodies to screen for Graves' disease. Will also add TPO Abs to check for initial hyperthyroid stages of Hashimoto's  thyroiditis. - If the tests remain abnormal, we may need an uptake and scan to differentiate between the 3 above possible etiologies  - we discussed about possible modalities of treatment for the above conditions, to include methimazole use, radioactive iodine ablation or (last resort) surgery. - we might need to do thyroid ultrasound depending on the results of the uptake and scan (if a cold nodule is present) -we may need to add beta blockers as she is tachycardic - I will advise her to check pulse at home and let me know if >90 at rest - RTC in 4 months, but likely sooner for repeat labs  Orders Placed This Encounter  Procedures  . T4, free  .  T3, free  . TSH  . Thyroid peroxidase antibody  . Thyroid Stimulating Immunoglobulin   Office Visit on 12/28/2015  Component Date Value Ref Range Status  . Free T4 12/28/2015 0.92  0.60 - 1.60 ng/dL Final  . T3, Free 82/95/621303/17/2017 3.1  2.3 - 4.2 pg/mL Final  . TSH 12/28/2015 0.81  0.35 - 4.50 uIU/mL Final   Component     Latest Ref Rng 12/28/2015  Thyroperoxidase Ab SerPl-aCnc     <9 IU/mL 2  TSI     <140 % baseline <89   TFTs and thyroid antibodies normal. I will recheck her TFTs at next visit, but no intervention needed for now.

## 2015-12-31 ENCOUNTER — Encounter: Payer: Self-pay | Admitting: Internal Medicine

## 2015-12-31 LAB — T4, FREE: Free T4: 0.92 ng/dL (ref 0.60–1.60)

## 2015-12-31 LAB — THYROID PEROXIDASE ANTIBODY: Thyroperoxidase Ab SerPl-aCnc: 2 IU/mL (ref <9)

## 2015-12-31 LAB — TSH: TSH: 0.81 u[IU]/mL (ref 0.35–4.50)

## 2015-12-31 LAB — T3, FREE: T3, Free: 3.1 pg/mL (ref 2.3–4.2)

## 2016-01-04 LAB — THYROID STIMULATING IMMUNOGLOBULIN: TSI: <89 % baseline (ref <140)

## 2016-02-25 MED FILL — ESTRADIOL 0.075 MG PATCH: 0.075 | 84 days supply | Qty: 24 | Fill #1

## 2016-03-21 ENCOUNTER — Encounter: Payer: Self-pay | Admitting: Gynecology

## 2016-03-26 DIAGNOSIS — Z803 Family history of malignant neoplasm of breast: Secondary | ICD-10-CM | POA: Diagnosis not present

## 2016-03-26 DIAGNOSIS — Z1231 Encounter for screening mammogram for malignant neoplasm of breast: Secondary | ICD-10-CM | POA: Diagnosis not present

## 2016-04-09 ENCOUNTER — Encounter: Payer: Self-pay | Admitting: Gynecology

## 2016-05-01 ENCOUNTER — Ambulatory Visit: Payer: 59 | Admitting: Internal Medicine

## 2016-05-08 ENCOUNTER — Encounter: Payer: Self-pay | Admitting: Internal Medicine

## 2016-05-08 ENCOUNTER — Ambulatory Visit (INDEPENDENT_AMBULATORY_CARE_PROVIDER_SITE_OTHER): Payer: 59 | Admitting: Internal Medicine

## 2016-05-08 VITALS — BP 140/88 | HR 98 | Ht 66.0 in | Wt 170.6 lb

## 2016-05-08 DIAGNOSIS — E0590 Thyrotoxicosis, unspecified without thyrotoxic crisis or storm: Secondary | ICD-10-CM

## 2016-05-08 DIAGNOSIS — R7303 Prediabetes: Secondary | ICD-10-CM | POA: Diagnosis not present

## 2016-05-08 DIAGNOSIS — E559 Vitamin D deficiency, unspecified: Secondary | ICD-10-CM

## 2016-05-08 LAB — T4, FREE: Free T4: 0.74 ng/dL (ref 0.60–1.60)

## 2016-05-08 LAB — VITAMIN D 25 HYDROXY (VIT D DEFICIENCY, FRACTURES): VITD: 47.03 ng/mL (ref 30.00–100.00)

## 2016-05-08 LAB — TSH: TSH: 1.49 u[IU]/mL (ref 0.35–4.50)

## 2016-05-08 LAB — HEMOGLOBIN A1C: Hgb A1c MFr Bld: 5.8 % (ref 4.6–6.5)

## 2016-05-08 LAB — T3, FREE: T3, Free: 3.5 pg/mL (ref 2.3–4.2)

## 2016-05-08 NOTE — Progress Notes (Signed)
Patient ID: Christina Lloyd, female   DOB: 26-May-1961, 55 y.o.   MRN: [id number]   HPI  Christina Lloyd is a 55 y.o.-year-old female, initially referred by Dr Audie Box, in consultation for subclinical thyrotoxicosis. Last visit 4 mo ago.  Subclinical hyperthyroidism: Pt has had low normal TSH levels over the years. Sister also has low normal TSH levels.  I reviewed pt's latest thyroid tests - normal fT4 and TSH at last visit: Lab Results  Component Value Date   TSH 0.81 12/28/2015   TSH 0.205 (L) 11/08/2015   TSH 0.287 (L) 11/01/2015   TSH 0.420 10/26/2014   TSH 0.520 10/25/2013   FREET4 0.92 12/28/2015    Thyroid Ab's were negative at last visit: Component     Latest Ref Rng 12/28/2015  Thyroperoxidase Ab SerPl-aCnc     <9 IU/mL 2  TSI     <140 % baseline <89   Pt denies feeling nodules in neck, hoarseness, dysphagia/odynophagia, SOB with lying down.  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 No FH of thyroid cancer. No h/o radiation tx to head or neck.  No seaweed or kelp, no recent contrast studies. No steroid use. No herbal supplements. + Biotin use - not recently.  I reviewed her chart and she also has a history of osteopenia - h/o fractures in feet, wrist.  Her brother has had a partial thyroidectomy >> growing goiter >> he is on Armour. No thyroid cancer hx.   FH of psoriasis.  She had a h/o vitamin D insufficiency >> latest level: Lab Results  Component Value Date   VD25OH 28 (L) 11/01/2015   VD25OH 28 (L) 10/26/2014   VD25OH 51 10/25/2013   She is now on 2000 units  - started to take this consistently in 10/2015.  She also has a h/o prediabetes:  Lab Results  Component Value Date    HGBA1C 5.8 (H) 11/08/2015   HGBA1C 6.0 (H) 10/26/2014   HGBA1C 6.0 (H) 10/25/2013    ROS: Constitutional: see HPI Eyes: no blurry vision, no xerophthalmia ENT: no sore throat, no nodules palpated in throat, no dysphagia/odynophagia, no hoarseness, + decreased hearing Cardiovascular: no CP/SOB/+ palpitations/no leg swelling Respiratory: no cough/SOB Gastrointestinal: no N/V/D/C Musculoskeletal: no muscle/joint aches Skin: + rash - rosacea on face Neurological: no tremors/no numbness/tingling/dizziness  I reviewed pt's medications, allergies, PMH, social hx, family hx, and changes were documented in the history of present illness. Otherwise, unchanged from my initial visit note.  Past Medical History:  Diagnosis Date  . Adenocarcinoma in situ (AIS) of uterine cervix 2012  . Blepharitis   . History of shingles   . Osteopenia 10/2012   T score -1.5. Statistically significant increased bone mineral density of lumbar spine compared to 2009  . Proctitis   . PVC (premature ventricular contraction)   . 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 GALLBLADDER SURGERY    . LAVH  2012   AIS, HGSIL  . PILONIDAL CYST EXCISION    . VAGINAL HYSTERECTOMY  Social History   Social History  . Marital Status: Single    Spouse Name: N/A  . 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 Smoking status: Former Smoker, quit in 2016  . Smokeless tobacco: Not on file  . Alcohol Use: 0.0 oz/week    0 Standard drinks or equivalent per week     Comment: occasionally  . Drug Use: No   Current Outpatient Prescriptions on File Prior to Visit  Medication Sig Dispense Refill  . Acetaminophen (TYLENOL PO) Take by mouth.    . estradiol (VIVELLE-DOT) 0.075 MG/24HR APPLY 1 PATCH TO SKIN TWICE PER WEEK 24 patch 4  . zolpidem  (AMBIEN) 10 MG tablet Take 1 tablet (10 mg total) by mouth at bedtime as needed. for sleep 30 tablet 5   No current facility-administered medications on file prior to visit.    Allergies  Allergen Reactions  . Penicillins Other (See Comments)    Childhood reaction  . Nsaids Other (See Comments)    Patient states she avoids NSAIDs as they call her proctitis to flare and hemorrhoids to bleed.   Family History  Problem Relation Age of Onset  . Heart disease Mother   . Alzheimer's disease Mother   . Heart disease Father   . Diabetes Father   . Heart disease Maternal Grandmother   . Diabetes Maternal Grandmother   . Heart disease Maternal Grandfather   . Diabetes Maternal Grandfather   . Stroke Paternal Grandmother   . Breast cancer Paternal Grandmother 26  . Stroke Paternal Grandfather   . Thyroid disease Brother    PE: BP 140/88   Pulse 98   Ht 5\' 6"  (1.676 m)   Wt 170 lb 9.6 oz (77.4 kg)   SpO2 98%   BMI 27.54 kg/m  Wt Readings from Last 3 Encounters:  05/08/16 170 lb 9.6 oz (77.4 kg)  12/28/15 169 lb (76.7 kg)  11/01/15 170 lb (77.1 kg)   Constitutional: overweight, in NAD Eyes: PERRLA, EOMI, no exophthalmos, no lid lag, no stare ENT: moist mucous membranes, + slight symmetric thyromegaly, no thyroid bruits, no cervical lymphadenopathy Cardiovascular: tachycardia, RR, No MRG Respiratory: CTA B Gastrointestinal: abdomen soft, NT, ND, BS+ Musculoskeletal: no deformities, strength intact in all 4 Skin: moist, warm, no rashes Neurological: no tremor with outstretched hands, DTR normal in all 4  ASSESSMENT: 1. Subclinical Thyrotoxicosis  2. Prediabetes  3. Vit D insufficiency  PLAN:  1. Patient with h/o low TSH x2 (resolved at last visit) with some possible thyrotoxic sxs: heat intolerance, but not new - after menopause, + palpitations - since the 1990s, + tachycardia (not new), no hyperdefecation, no anxiety, no weight loss.  - at last visit, we checked her TSI  and TPO Ab's >> negative.  - as labs normalized at last visit >> we did not intervene. At today's visit >> she feels well, no changes since last visit - will check the TSH, fT3 and fT4 today - If the tests remain abnormal, we may need an uptake and scan to differentiate between the 3 above possible etiologies  - we might need to do thyroid ultrasound at next visit - she has some neck fullness - RTC in 6 months, but possibly sooner for repeat labs  2. Prediabetes - reviewed previous HbA1c  - will recheck one at her request  3. Vit D insufficiency - reviewed previous vit D level >> since then  she started vit D 2000 Units daily - will recheck one at her request  Orders Placed This Encounter  Procedures  . T4, free  . T3, free  . TSH  . VITAMIN D 25 Hydroxy (Vit-D Deficiency, Fractures)  . Hemoglobin A1c    Office Visit on 05/08/2016  Component Date Value Ref Range Status  . Free T4 05/08/2016 0.74  0.60 - 1.60 ng/dL Final  . T3, Free 78/29/5621 3.5  2.3 - 4.2 pg/mL Final  . TSH 05/08/2016 1.49  0.35 - 4.50 uIU/mL Final  . VITD 05/08/2016 47.03  30.00 - 100.00 ng/mL Final  . Hgb A1c MFr Bld 05/08/2016 5.8  4.6 - 6.5 % Final   All labs are great!  Carlus Pavlov, MD PhD [facility]

## 2016-05-08 NOTE — Patient Instructions (Signed)
Please stop at the lab.  Please come back for a follow-up appointment in 6 months.  

## 2016-05-28 ENCOUNTER — Other Ambulatory Visit: Payer: Self-pay | Admitting: Gynecology

## 2016-05-28 NOTE — Telephone Encounter (Signed)
Called into pharmacy

## 2016-05-29 MED FILL — ESTRADIOL 0.075 MG PATCH: 0.075 | 84 days supply | Qty: 24 | Fill #2

## 2016-08-11 MED FILL — ESTRADIOL 0.075 MG PATCH: 0.075 | 84 days supply | Qty: 24 | Fill #3

## 2016-08-16 ENCOUNTER — Ambulatory Visit (INDEPENDENT_AMBULATORY_CARE_PROVIDER_SITE_OTHER): Payer: 59

## 2016-08-16 ENCOUNTER — Ambulatory Visit (HOSPITAL_COMMUNITY)
Admission: EM | Admit: 2016-08-16 | Discharge: 2016-08-16 | Disposition: A | Discharge status: Home / Self Care | Payer: 59 | Attending: Emergency Medicine | Admitting: Emergency Medicine

## 2016-08-16 ENCOUNTER — Encounter (HOSPITAL_COMMUNITY): Payer: Self-pay | Admitting: Emergency Medicine

## 2016-08-16 DIAGNOSIS — S92911A Unspecified fracture of right toe(s), initial encounter for closed fracture: Secondary | ICD-10-CM | POA: Diagnosis not present

## 2016-08-16 DIAGNOSIS — S92511A Displaced fracture of proximal phalanx of right lesser toe(s), initial encounter for closed fracture: Secondary | ICD-10-CM | POA: Diagnosis not present

## 2016-08-16 NOTE — Discharge Instructions (Signed)
Keep elevated. Put ice on the area for the first 2-3 days off and on. Wear the hard sole shoe so as not to constantly flex the toe and reinjure the fracture and keep it from healing. Keep the toes taped together for support. Elevate to help with pain, swelling. Ibuprofen and/or Tylenol as needed for pain.

## 2016-08-16 NOTE — ED Provider Notes (Signed)
CSN: [id number]     Arrival date & time 08/16/16  1212 History   First MD Initiated Contact with Patient 08/16/16 1413     Chief Complaint  Patient presents with  . Toe Injury   (Consider location/radiation/quality/duration/timing/severity/associated sxs/prior Treatment) 55 year old female states she struck or jammed the fifth toe against a locker this morning. She is complaining of pain, swelling and ecchymosis to the fourth toe. Denies other injury.      Past Medical History:  Diagnosis Date  . Adenocarcinoma in situ (AIS) of uterine cervix 2012  . Blepharitis   . History of shingles   . Osteopenia 10/2012   T score -1.5. Statistically significant increased bone mineral density of lumbar spine compared to 2009  . Proctitis   . PVC (premature ventricular contraction)   . 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 GALLBLADDER SURGERY    . LAVH  2012   AIS, HGSIL  . PILONIDAL CYST EXCISION    . VAGINAL HYSTERECTOMY     Family History  Problem Relation Age of Onset  . Heart disease Mother   . Alzheimer's disease Mother   . Heart disease Father   . Diabetes Father   . Heart disease Maternal Grandmother   . Diabetes Maternal Grandmother   . Heart disease Maternal Grandfather   . Diabetes Maternal Grandfather   . Stroke Paternal Grandmother   . Breast cancer Paternal Grandmother [id number]  . Stroke Paternal Grandfather   . Thyroid disease Brother    Social History  Substance Use Topics  . Smoking status: Former Games developermoker  . Smokeless tobacco: Never Used  . Alcohol use 0.0 oz/week     Comment: occasionally   OB History    Gravida Para Term Preterm AB Living   1       1 0   SAB TAB Ectopic Multiple Live Births                 Review of Systems  Constitutional: Negative.   Respiratory: Negative.   Musculoskeletal: Negative for gait problem.       As  per history of present illness  Neurological: Negative.   All other systems reviewed and are negative.   Allergies  Penicillins and Nsaids  Home Medications   Prior to Admission medications   Medication Sig Start Date End Date Taking? Authorizing Provider  Acetaminophen (TYLENOL PO) Take by mouth.   Yes Historical Provider, MD  Cholecalciferol (VITAMIN D) 2000 units CAPS Take 2,000 Units by mouth daily.   Yes Historical Provider, MD  estradiol (VIVELLE-DOT) 0.075 MG/24HR APPLY 1 PATCH TO SKIN TWICE PER WEEK 11/01/15  Yes Dara Lordsimothy P Fontaine, MD  zolpidem (AMBIEN) 10 MG tablet take 1 tablet by mouth at bedtime if needed for sleep 05/28/16  Yes Dara Lordsimothy P Fontaine, MD   Meds Ordered and Administered this Visit  Medications - No data to display  BP 134/87 (BP Location: Left Arm)   Pulse 89   Temp 98.6 F (37 C) (Oral)   Resp 18   SpO2 99%  No data found.   Physical Exam  Constitutional: She is oriented to person, place, and time. She appears well-developed and well-nourished.  Neck: Neck supple.  Cardiovascular: Normal rate.   Pulmonary/Chest: Effort normal.  Musculoskeletal:  Right fourth toe with minor deviation but no deformity, mild swelling and ecchymosis.  Neurological: She is  alert and oriented to person, place, and time.  Skin: Skin is warm.  Psychiatric: She has a normal mood and affect.  Nursing note and vitals reviewed.   Urgent Care Course   Clinical Course    Procedures (including critical care time)  Labs Review Labs Reviewed - No data to display  Imaging Review Dg Foot Complete Right  Result Date: 08/16/2016 CLINICAL DATA:  Stubbed foot today with fourth digit pain, initial encounter EXAM: RIGHT FOOT COMPLETE - 3+ VIEW COMPARISON:  None. FINDINGS: Minimally displaced fracture of the fourth proximal phalanx is identified. No other focal abnormality is noted. IMPRESSION: Fourth proximal phalangeal fracture. Electronically Signed   By: Alcide CleverMark  Lukens M.D.    On: 08/16/2016 13:25     Visual Acuity Review  Right Eye Distance:   Left Eye Distance:   Bilateral Distance:    Right Eye Near:   Left Eye Near:    Bilateral Near:         MDM   1. Closed nondisplaced fracture of phalanx of toe of right foot, unspecified toe, initial encounter    Keep elevated. Put ice on the area for the first 2-3 days off and on. Wear the hard sole shoe so as not to constantly flex the toe and reinjure the fracture and keep it from healing. Keep the toes taped together for support. Elevate to help with pain, swelling. Ibuprofen and/or Tylenol as needed for pain. Buddy tape toes and hard sole shoe. Follow-up with your orthopedist next week. Call for appointment.     Hayden Rasmussenavid Estill Llerena, NP 08/16/16 1426

## 2016-08-16 NOTE — ED Triage Notes (Signed)
Pt inj 4th right toe onset 1000 today... Reports she jammed it against a wooden furniture  Sx include: deformity of 4th toe, pain and it increases w/activity  A&O x4... NAD

## 2016-08-18 DIAGNOSIS — S92514A Nondisplaced fracture of proximal phalanx of right lesser toe(s), initial encounter for closed fracture: Secondary | ICD-10-CM | POA: Diagnosis not present

## 2016-08-26 DIAGNOSIS — S92514D Nondisplaced fracture of proximal phalanx of right lesser toe(s), subsequent encounter for fracture with routine healing: Secondary | ICD-10-CM | POA: Diagnosis not present

## 2016-08-27 DIAGNOSIS — H5203 Hypermetropia, bilateral: Secondary | ICD-10-CM | POA: Diagnosis not present

## 2016-08-27 DIAGNOSIS — H52222 Regular astigmatism, left eye: Secondary | ICD-10-CM | POA: Diagnosis not present

## 2016-08-27 DIAGNOSIS — H524 Presbyopia: Secondary | ICD-10-CM | POA: Diagnosis not present

## 2016-09-03 DIAGNOSIS — H903 Sensorineural hearing loss, bilateral: Secondary | ICD-10-CM | POA: Diagnosis not present

## 2016-09-18 DIAGNOSIS — H903 Sensorineural hearing loss, bilateral: Secondary | ICD-10-CM | POA: Diagnosis not present

## 2016-11-06 ENCOUNTER — Encounter: Payer: Self-pay | Admitting: Gynecology

## 2016-11-06 ENCOUNTER — Ambulatory Visit (INDEPENDENT_AMBULATORY_CARE_PROVIDER_SITE_OTHER): Payer: 59 | Admitting: Gynecology

## 2016-11-06 VITALS — BP 130/84 | Ht 66.0 in | Wt 170.0 lb

## 2016-11-06 DIAGNOSIS — Z23 Encounter for immunization: Secondary | ICD-10-CM

## 2016-11-06 DIAGNOSIS — Z13220 Encounter for screening for lipoid disorders: Secondary | ICD-10-CM

## 2016-11-06 DIAGNOSIS — Z01411 Encounter for gynecological examination (general) (routine) with abnormal findings: Secondary | ICD-10-CM

## 2016-11-06 DIAGNOSIS — M8580 Other specified disorders of bone density and structure, unspecified site: Secondary | ICD-10-CM

## 2016-11-06 DIAGNOSIS — Z1329 Encounter for screening for other suspected endocrine disorder: Secondary | ICD-10-CM | POA: Diagnosis not present

## 2016-11-06 DIAGNOSIS — Z79890 Hormone replacement therapy (postmenopausal): Secondary | ICD-10-CM

## 2016-11-06 DIAGNOSIS — N952 Postmenopausal atrophic vaginitis: Secondary | ICD-10-CM

## 2016-11-06 LAB — CBC WITH DIFFERENTIAL/PLATELET
Basophils Absolute: 0 cells/uL (ref 0–200)
Basophils Relative: 0 %
Eosinophils Absolute: 250 cells/uL (ref 15–500)
Eosinophils Relative: 2 %
HCT: 42.5 % (ref 35.0–45.0)
Hemoglobin: 14.5 g/dL (ref 11.7–15.5)
Lymphocytes Relative: 26 %
Lymphs Abs: 3250 cells/uL (ref 850–3900)
MCH: 29.4 pg (ref 27.0–33.0)
MCHC: 34.1 g/dL (ref 32.0–36.0)
MCV: 86 fL (ref 80.0–100.0)
MPV: 8.9 fL (ref 7.5–12.5)
Monocytes Absolute: 625 cells/uL (ref 200–950)
Monocytes Relative: 5 %
Neutro Abs: 8375 cells/uL — ABNORMAL HIGH (ref 1500–7800)
Neutrophils Relative %: 67 %
Platelets: 339 10*3/uL (ref 140–400)
RBC: 4.94 MIL/uL (ref 3.80–5.10)
RDW: 13.7 % (ref 11.0–15.0)
WBC: 12.5 10*3/uL — ABNORMAL HIGH (ref 3.8–10.8)

## 2016-11-06 LAB — COMPREHENSIVE METABOLIC PANEL
ALT: 13 U/L (ref 6–29)
AST: 15 U/L (ref 10–35)
Albumin: 4.4 g/dL (ref 3.6–5.1)
Alkaline Phosphatase: 95 U/L (ref 33–130)
BUN: 16 mg/dL (ref 7–25)
CO2: 20 mmol/L (ref 20–31)
Calcium: 9.3 mg/dL (ref 8.6–10.4)
Chloride: 106 mmol/L (ref 98–110)
Creat: 0.68 mg/dL (ref 0.50–1.05)
Glucose, Bld: 79 mg/dL (ref 65–99)
Potassium: 3.9 mmol/L (ref 3.5–5.3)
Sodium: 141 mmol/L (ref 135–146)
Total Bilirubin: 0.5 mg/dL (ref 0.2–1.2)
Total Protein: 6.7 g/dL (ref 6.1–8.1)

## 2016-11-06 LAB — LIPID PANEL
Cholesterol: 176 mg/dL (ref <200)
HDL: 43 mg/dL — ABNORMAL LOW (ref >50)
LDL Cholesterol: 86 mg/dL (ref <100)
Total CHOL/HDL Ratio: 4.1 Ratio (ref <5.0)
Triglycerides: 235 mg/dL — ABNORMAL HIGH (ref <150)
VLDL: 47 mg/dL — ABNORMAL HIGH (ref <30)

## 2016-11-06 LAB — TSH: TSH: 0.49 mIU/L

## 2016-11-06 MED ORDER — ZOLPIDEM TARTRATE 10 MG PO TABS: ORAL_TABLET | ORAL | 5 refills | Status: DC

## 2016-11-06 MED ORDER — ESTRADIOL 0.05 MG/24HR TD PTTW: 1.0000 | MEDICATED_PATCH | TRANSDERMAL | 12 refills | Status: DC

## 2016-11-06 MED FILL — ESTRADIOL 0.05 MG PATCH: 0.05 | 84 days supply | Qty: 24 | Fill #0

## 2016-11-06 NOTE — Patient Instructions (Signed)

## 2016-11-06 NOTE — Progress Notes (Signed)
    Christina ShearsKaren L Lloyd [birth year] [id number]        56 y.o.  G1P0010 for annual exam.    Past medical history,surgical history, problem list, medications, allergies, family history and social history were all reviewed and documented as reviewed in the EPIC chart.  ROS:  Performed with pertinent positives and negatives included in the history, assessment and plan.   Additional significant findings :  None   Exam: Christina PortelaKim Lloyd assistant Vitals:   11/06/16 1406  BP: 130/84  Weight: 170 lb (77.1 kg)  Height: 5\' 6"  (1.676 m)   Body mass index is 27.44 kg/m.  General appearance:  Normal affect, orientation and appearance. Skin: Grossly normal HEENT: Without gross lesions.  No cervical or supraclavicular adenopathy. Thyroid normal.  Lungs:  Clear without wheezing, rales or rhonchi Cardiac: RR, without RMG Abdominal:  Soft, nontender, without masses, guarding, rebound, organomegaly or hernia Breasts:  Examined lying and sitting without masses, retractions, discharge or axillary adenopathy. Pelvic:  Ext, BUS, Vagina normal with mild atrophic changes  Adnexa without masses or tenderness    Anus and perineum normal   Rectovaginal normal sphincter tone without palpated masses or tenderness.    Assessment/Plan:  56 y.o. [id number] female for annual exam.   1. Postmenopausal/atrophic genital changes/ERT. 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 Had been on Vivelle 0.075 mg patch but has tried weaning by cutting in half. She has noticed since then issues with sleep. She has read a lot and had concerns about risks versus benefits to include dementia heart disease cancer. I reviewed the whole issue of HRT with her to include the 2017 NAMS guidelines. Benefits to include symptom relief possible cardiovascular and bone health versus risks to include stroke heart attack DVT and the breast cancer issue. Issue of dementia and hormone replacement was also reviewed. After a  lengthy discussion she is comfortable with continuing. I did recommend trying the Vivelle 0.05 mg patch and she is going to do so. If she has any issues that she'll call. Refill 1 year provided. 2. Mammography coming due in June and I reminded her to schedule this. SBE monthly reviewed. 3. Pap smear 2017. Pap smear done today. History of high-grade dysplasia and adenocarcinoma in situ. Subsequently had VAIN 1 in 2015. 4. Osteopenia. DEXA 2014 T score -1.5. Improvement from her prior DEXA. Patient prefers to wait to repeat if possible. We'll plan next year at 5 year interval. Check vitamin D level today. 5. Colonoscopy 2013. Repeat at their recommended interval. 6. Sleep disturbance. Uses Ambien 10 mg intermittently due to shift changes at work. #30 with 5 refills provided. 7. Health maintenance. Baseline CBC, CMP, lipid profile, TSH, vitamin D, urinalysis ordered. Tdap vaccination today. Follow up in one year, sooner as needed.   Christina LordsFONTAINE,Christina Lloyd P MD, 2:48 PM 11/06/2016

## 2016-11-06 NOTE — Addendum Note (Signed)
Addended by: Dayna BarkerGARDNER, Jisela Merlino K on: 11/06/2016 03:44 PM   Modules accepted: Orders

## 2016-11-07 ENCOUNTER — Ambulatory Visit: Payer: 59 | Admitting: Internal Medicine

## 2016-11-07 ENCOUNTER — Telehealth: Payer: Self-pay | Admitting: Gynecology

## 2016-11-07 ENCOUNTER — Encounter: Payer: Self-pay | Admitting: Gynecology

## 2016-11-07 ENCOUNTER — Other Ambulatory Visit: Payer: Self-pay | Admitting: Gynecology

## 2016-11-07 DIAGNOSIS — E781 Pure hyperglyceridemia: Secondary | ICD-10-CM

## 2016-11-07 LAB — URINALYSIS W MICROSCOPIC + REFLEX CULTURE
Bacteria, UA: NONE SEEN [HPF]
Bilirubin Urine: NEGATIVE
Casts: NONE SEEN [LPF]
Crystals: NONE SEEN [HPF]
Glucose, UA: NEGATIVE
Hgb urine dipstick: NEGATIVE
Ketones, ur: NEGATIVE
Leukocytes, UA: NEGATIVE
Nitrite: NEGATIVE
Protein, ur: NEGATIVE
RBC / HPF: NONE SEEN RBC/HPF (ref <=2)
Specific Gravity, Urine: 1.03 (ref 1.001–1.035)
WBC, UA: NONE SEEN WBC/HPF (ref <=5)
Yeast: NONE SEEN [HPF]
pH: 5.5 (ref 5.0–8.0)

## 2016-11-07 LAB — HEMOGLOBIN A1C
Hgb A1c MFr Bld: 5.5 % (ref <5.7)
Mean Plasma Glucose: 111 mg/dL

## 2016-11-07 LAB — VITAMIN D 25 HYDROXY (VIT D DEFICIENCY, FRACTURES): Vit D, 25-Hydroxy: 40 ng/mL (ref 30–100)

## 2016-11-07 NOTE — Telephone Encounter (Signed)
Pt informed with the below note. Pt will schedule lab appointment

## 2016-11-07 NOTE — Telephone Encounter (Signed)
Call patient. I just emailed her to tell her about her abnormal lipid profile.  Tell her I miss read her values in that her LDL was normal not elevated but her triglycerides were 235 which is elevated. I would still recommend repeating a fasting lipid profile and I put a future ordering for her to relook at the triglycerides..Marland Kitchen

## 2016-11-10 LAB — PAP IG W/ RFLX HPV ASCU

## 2017-01-27 ENCOUNTER — Encounter: Payer: Self-pay | Admitting: Gynecology

## 2017-01-27 MED ORDER — ESTRADIOL 0.075 MG/24HR TD PTTW: 1.0000 | MEDICATED_PATCH | TRANSDERMAL | 8 refills | Status: DC

## 2017-01-27 NOTE — Telephone Encounter (Signed)
Okay for Vivelle 0.075 mg patch twice weekly. Refill through her next scheduled annual exam

## 2017-01-28 MED FILL — ESTRADIOL 0.075 MG PATCH: 0.075 | 84 days supply | Qty: 24 | Fill #0

## 2017-03-20 ENCOUNTER — Other Ambulatory Visit: Payer: 59

## 2017-03-20 DIAGNOSIS — E781 Pure hyperglyceridemia: Secondary | ICD-10-CM | POA: Diagnosis not present

## 2017-03-20 LAB — LIPID PANEL
Cholesterol: 171 mg/dL (ref <200)
HDL: 44 mg/dL — ABNORMAL LOW (ref >50)
LDL Cholesterol: 92 mg/dL (ref <100)
Total CHOL/HDL Ratio: 3.9 Ratio (ref <5.0)
Triglycerides: 173 mg/dL — ABNORMAL HIGH (ref <150)
VLDL: 35 mg/dL — ABNORMAL HIGH (ref <30)

## 2017-03-27 ENCOUNTER — Encounter: Payer: Self-pay | Admitting: Gynecology

## 2017-03-27 DIAGNOSIS — Z803 Family history of malignant neoplasm of breast: Secondary | ICD-10-CM | POA: Diagnosis not present

## 2017-03-27 DIAGNOSIS — Z1231 Encounter for screening mammogram for malignant neoplasm of breast: Secondary | ICD-10-CM | POA: Diagnosis not present

## 2017-04-14 MED FILL — ESTRADIOL 0.075 MG PATCH: 0.075 | 84 days supply | Qty: 24 | Fill #1

## 2017-06-02 ENCOUNTER — Encounter: Payer: Self-pay | Admitting: Gynecology

## 2017-06-02 MED ORDER — ZOLPIDEM TARTRATE 10 MG PO TABS: ORAL_TABLET | ORAL | 5 refills | Status: DC

## 2017-06-02 NOTE — Telephone Encounter (Signed)
Okay to refill ambien 10 mg #30 with 5 refills to new pharmacy

## 2017-06-02 NOTE — Telephone Encounter (Signed)
Rx called in 

## 2017-07-10 MED FILL — ESTRADIOL 0.075 MG PATCH: 0.075 | 84 days supply | Qty: 24 | Fill #2

## 2017-08-31 DIAGNOSIS — H52223 Regular astigmatism, bilateral: Secondary | ICD-10-CM | POA: Diagnosis not present

## 2017-08-31 DIAGNOSIS — H524 Presbyopia: Secondary | ICD-10-CM | POA: Diagnosis not present

## 2017-08-31 DIAGNOSIS — H5203 Hypermetropia, bilateral: Secondary | ICD-10-CM | POA: Diagnosis not present

## 2017-10-01 ENCOUNTER — Other Ambulatory Visit: Payer: Self-pay | Admitting: Gynecology

## 2017-10-01 MED FILL — ESTRADIOL 0.075 MG PATCH: 0.075 | 28 days supply | Qty: 8 | Fill #0

## 2017-11-12 ENCOUNTER — Encounter: Payer: 59 | Admitting: Gynecology

## 2017-11-25 ENCOUNTER — Encounter: Payer: Self-pay | Admitting: Gynecology

## 2017-11-25 ENCOUNTER — Ambulatory Visit (INDEPENDENT_AMBULATORY_CARE_PROVIDER_SITE_OTHER): Payer: 59 | Admitting: Gynecology

## 2017-11-25 VITALS — BP 124/78 | Ht 66.0 in | Wt 176.0 lb

## 2017-11-25 DIAGNOSIS — N393 Stress incontinence (female) (male): Secondary | ICD-10-CM | POA: Diagnosis not present

## 2017-11-25 DIAGNOSIS — Z79890 Hormone replacement therapy (postmenopausal): Secondary | ICD-10-CM

## 2017-11-25 DIAGNOSIS — G4700 Insomnia, unspecified: Secondary | ICD-10-CM | POA: Diagnosis not present

## 2017-11-25 DIAGNOSIS — R87628 Other abnormal cytological findings on specimens from vagina: Secondary | ICD-10-CM | POA: Diagnosis not present

## 2017-11-25 DIAGNOSIS — Z1329 Encounter for screening for other suspected endocrine disorder: Secondary | ICD-10-CM | POA: Diagnosis not present

## 2017-11-25 DIAGNOSIS — Z01411 Encounter for gynecological examination (general) (routine) with abnormal findings: Secondary | ICD-10-CM

## 2017-11-25 DIAGNOSIS — Z13220 Encounter for screening for lipoid disorders: Secondary | ICD-10-CM

## 2017-11-25 DIAGNOSIS — M8580 Other specified disorders of bone density and structure, unspecified site: Secondary | ICD-10-CM

## 2017-11-25 DIAGNOSIS — Z1321 Encounter for screening for nutritional disorder: Secondary | ICD-10-CM | POA: Diagnosis not present

## 2017-11-25 DIAGNOSIS — N952 Postmenopausal atrophic vaginitis: Secondary | ICD-10-CM

## 2017-11-25 MED ORDER — ZOLPIDEM TARTRATE 10 MG PO TABS: ORAL_TABLET | ORAL | 5 refills | Status: DC

## 2017-11-25 MED ORDER — ESTRADIOL 0.075 MG/24HR TD PTTW: MEDICATED_PATCH | TRANSDERMAL | 12 refills | Status: DC

## 2017-11-25 MED FILL — ESTRADIOL 0.075 MG PATCH: 0.075 | 28 days supply | Qty: 8 | Fill #1

## 2017-11-25 NOTE — Addendum Note (Signed)
Addended by: Dayna BarkerGARDNER, KIMBERLY K on: 11/25/2017 11:07 AM   Modules accepted: Orders

## 2017-11-25 NOTE — Progress Notes (Signed)
    Christina ShearsKaren L Lloyd [birth year] [id number]        57 y.o.  G1P0010 for annual gynecologic exam.  Doing well without gynecologic complaints.  Past medical history,surgical history, problem list, medications, allergies, family history and social history were all reviewed and documented as reviewed in the EPIC chart.  ROS:  Performed with pertinent positives and negatives included in the history, assessment and plan.   Additional significant findings : None   Exam: Kennon PortelaKim Lloyd assistant Vitals:   11/25/17 1007  BP: 124/78  Weight: 176 lb (79.8 kg)  Height: 5\' 6"  (1.676 m)   Body mass index is 28.41 kg/m.  General appearance:  Normal affect, orientation and appearance. Skin: Grossly normal HEENT: Without gross lesions.  No cervical or supraclavicular adenopathy. Thyroid normal.  Lungs:  Clear without wheezing, rales or rhonchi Cardiac: RR, without RMG Abdominal:  Soft, nontender, without masses, guarding, rebound, organomegaly or hernia Breasts:  Examined lying and sitting without masses, retractions, discharge or axillary adenopathy. Pelvic:  Ext, BUS, Vagina: With mild atrophic changes.  Pap smear done  Adnexa: Without masses or tenderness    Anus and perineum: Normal   Rectovaginal: Normal sphincter tone without palpated masses or tenderness.    Assessment/Plan:  57 y.o. 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   1. HRT.  Continues on Vivelle 0.075 mg patches.  Is doing well and wants to continue.  We again reviewed the risks versus benefits to include stroke heart attack DVT in the breast cancer issue versus benefits of symptom relief and possible cardiovascular and bone health.  At this point the patient wants to continue and I refilled her times 1 year. 2. Insomnia.  Works night shifts and uses Ambien intermittently.  Ambien 10 mg #30 with 5 refills provided.  Patient is having no side effects or  sleep activities using this. 3. Osteopenia.  DEXA 2014 T score -1.5.  Schedule DEXA now at 5-year interval and she agrees to follow-up for this.  Check vitamin D level today. 4. Mammography 03/2017.  Continue with annual mammography when due.  SBE monthly reviewed.  Breast exam normal today. 5. Colonoscopy 2013.  Repeat at their recommended interval. 6. Pap smear 2018.  Pap smear done today.  History of high-grade dysplasia and adenocarcinoma in situ leading to hysterectomy.  Subsequent vein in 2015.  We will continue with annual cytology. 7. Health maintenance.  Patient requests baseline labs.  CBC, CMP, lipid profile, TSH, vitamin D, hemoglobin A1c, urine analysis ordered.  Follow-up in 1 year, sooner as needed.   Dara Lordsimothy P Fontaine MD, 11:03 AM 11/25/2017

## 2017-11-25 NOTE — Patient Instructions (Signed)
Follow-up in 1 year, sooner as needed. 

## 2017-11-26 ENCOUNTER — Other Ambulatory Visit: Payer: Self-pay | Admitting: Gynecology

## 2017-11-26 DIAGNOSIS — R7989 Other specified abnormal findings of blood chemistry: Secondary | ICD-10-CM

## 2017-11-26 LAB — CBC WITH DIFFERENTIAL/PLATELET
Basophils Absolute: 40 cells/uL (ref 0–200)
Basophils Relative: 0.4 %
Eosinophils Absolute: 310 cells/uL (ref 15–500)
Eosinophils Relative: 3.1 %
HCT: 42.9 % (ref 35.0–45.0)
Hemoglobin: 14.9 g/dL (ref 11.7–15.5)
Lymphs Abs: 2230 cells/uL (ref 850–3900)
MCH: 29.4 pg (ref 27.0–33.0)
MCHC: 34.7 g/dL (ref 32.0–36.0)
MCV: 84.6 fL (ref 80.0–100.0)
MPV: 9.1 fL (ref 7.5–12.5)
Monocytes Relative: 5.1 %
Neutro Abs: 6910 cells/uL (ref 1500–7800)
Neutrophils Relative %: 69.1 %
Platelets: 350 10*3/uL (ref 140–400)
RBC: 5.07 10*6/uL (ref 3.80–5.10)
RDW: 12.5 % (ref 11.0–15.0)
Total Lymphocyte: 22.3 %
WBC mixed population: 510 cells/uL (ref 200–950)
WBC: 10 10*3/uL (ref 3.8–10.8)

## 2017-11-26 LAB — HEMOGLOBIN A1C
Hgb A1c MFr Bld: 5.7 % of total Hgb — ABNORMAL HIGH (ref <5.7)
Mean Plasma Glucose: 117 (calc)
eAG (mmol/L): 6.5 (calc)

## 2017-11-26 LAB — COMPREHENSIVE METABOLIC PANEL
AG Ratio: 1.9 (calc) (ref 1.0–2.5)
ALT: 10 U/L (ref 6–29)
AST: 12 U/L (ref 10–35)
Albumin: 4.4 g/dL (ref 3.6–5.1)
Alkaline phosphatase (APISO): 98 U/L (ref 33–130)
BUN: 12 mg/dL (ref 7–25)
CO2: 27 mmol/L (ref 20–32)
Calcium: 9.3 mg/dL (ref 8.6–10.4)
Chloride: 103 mmol/L (ref 98–110)
Creat: 0.75 mg/dL (ref 0.50–1.05)
Globulin: 2.3 g/dL (calc) (ref 1.9–3.7)
Glucose, Bld: 95 mg/dL (ref 65–99)
Potassium: 3.9 mmol/L (ref 3.5–5.3)
Sodium: 139 mmol/L (ref 135–146)
Total Bilirubin: 0.6 mg/dL (ref 0.2–1.2)
Total Protein: 6.7 g/dL (ref 6.1–8.1)

## 2017-11-26 LAB — LIPID PANEL
Cholesterol: 184 mg/dL (ref <200)
HDL: 51 mg/dL (ref >50)
LDL Cholesterol (Calc): 107 mg/dL (calc) — ABNORMAL HIGH
Non-HDL Cholesterol (Calc): 133 mg/dL (calc) — ABNORMAL HIGH (ref <130)
Total CHOL/HDL Ratio: 3.6 (calc) (ref <5.0)
Triglycerides: 147 mg/dL (ref <150)

## 2017-11-26 LAB — VITAMIN D 25 HYDROXY (VIT D DEFICIENCY, FRACTURES): Vit D, 25-Hydroxy: 70 ng/mL (ref 30–100)

## 2017-11-26 LAB — TSH: TSH: 0.37 mIU/L — ABNORMAL LOW (ref 0.40–4.50)

## 2017-11-30 LAB — PAP IG W/ RFLX HPV ASCU

## 2017-12-01 LAB — URINALYSIS, COMPLETE W/RFL CULTURE
Bilirubin Urine: NEGATIVE
Glucose, UA: NEGATIVE
Hgb urine dipstick: NEGATIVE
Hyaline Cast: NONE SEEN /LPF
Ketones, ur: NEGATIVE
Nitrites, Initial: NEGATIVE
Protein, ur: NEGATIVE
RBC / HPF: NONE SEEN /HPF (ref 0–2)
Specific Gravity, Urine: 1.015 (ref 1.001–1.03)
pH: 6 (ref 5.0–8.0)

## 2017-12-01 LAB — URINE CULTURE
MICRO NUMBER:: 90193207
SPECIMEN QUALITY:: ADEQUATE

## 2017-12-01 LAB — CULTURE INDICATED

## 2017-12-17 MED FILL — ESTRADIOL 0.075 MG PATCH: 0.075 | 84 days supply | Qty: 24 | Fill #0

## 2018-03-09 MED FILL — ESTRADIOL 0.075 MG PATCH: 0.075 | 84 days supply | Qty: 24 | Fill #1

## 2018-04-02 ENCOUNTER — Encounter: Payer: Self-pay | Admitting: Gynecology

## 2018-04-02 DIAGNOSIS — Z803 Family history of malignant neoplasm of breast: Secondary | ICD-10-CM | POA: Diagnosis not present

## 2018-04-02 DIAGNOSIS — Z1213 Encounter for screening for malignant neoplasm of small intestine: Secondary | ICD-10-CM | POA: Diagnosis not present

## 2018-04-08 ENCOUNTER — Telehealth: Payer: Self-pay | Admitting: *Deleted

## 2018-04-08 DIAGNOSIS — R7989 Other specified abnormal findings of blood chemistry: Secondary | ICD-10-CM

## 2018-04-08 NOTE — Telephone Encounter (Signed)
Per my chart message by Dr. Audie BoxFontaine (11/26/17) he wanted patient to come back and repeat TSH with thyroid panel. Only TSH was placed. Pt coming on 04/12/18. Will place order for thyroid panel as well.

## 2018-04-09 ENCOUNTER — Encounter: Payer: Self-pay | Admitting: Gynecology

## 2018-04-12 ENCOUNTER — Other Ambulatory Visit: Payer: 59

## 2018-04-12 DIAGNOSIS — R7989 Other specified abnormal findings of blood chemistry: Secondary | ICD-10-CM

## 2018-04-12 LAB — THYROID PANEL WITH TSH
Free Thyroxine Index: 2.6 (ref 1.4–3.8)
T3 Uptake: 31 % (ref 22–35)
T4, Total: 8.5 ug/dL (ref 5.1–11.9)
TSH: 0.5 mIU/L (ref 0.40–4.50)

## 2018-04-13 ENCOUNTER — Encounter: Payer: Self-pay | Admitting: Gynecology

## 2018-05-27 ENCOUNTER — Other Ambulatory Visit: Payer: Self-pay | Admitting: Gynecology

## 2018-05-28 NOTE — Telephone Encounter (Signed)
Rx called

## 2018-06-03 MED FILL — ESTRADIOL 0.075 MG PATCH: 0.075 | 84 days supply | Qty: 24 | Fill #2

## 2018-08-17 MED FILL — DOTTI 0.075 MG/24HR PTTW: 0.075 | 84 days supply | Qty: 24 | Fill #3

## 2018-08-18 ENCOUNTER — Encounter: Payer: Self-pay | Admitting: *Deleted

## 2018-08-25 DIAGNOSIS — M85851 Other specified disorders of bone density and structure, right thigh: Secondary | ICD-10-CM | POA: Diagnosis not present

## 2018-08-25 DIAGNOSIS — Z90710 Acquired absence of both cervix and uterus: Secondary | ICD-10-CM | POA: Diagnosis not present

## 2018-08-25 DIAGNOSIS — Z8262 Family history of osteoporosis: Secondary | ICD-10-CM | POA: Diagnosis not present

## 2018-08-27 ENCOUNTER — Encounter: Payer: Self-pay | Admitting: Gynecology

## 2018-09-01 DIAGNOSIS — H524 Presbyopia: Secondary | ICD-10-CM | POA: Diagnosis not present

## 2018-09-29 ENCOUNTER — Encounter: Payer: Self-pay | Admitting: Gynecology

## 2018-12-08 ENCOUNTER — Ambulatory Visit (INDEPENDENT_AMBULATORY_CARE_PROVIDER_SITE_OTHER): Payer: BLUE CROSS/BLUE SHIELD | Admitting: Gynecology

## 2018-12-08 ENCOUNTER — Telehealth: Payer: Self-pay | Admitting: *Deleted

## 2018-12-08 ENCOUNTER — Encounter: Payer: Self-pay | Admitting: Gynecology

## 2018-12-08 VITALS — BP 124/80 | Ht 66.0 in | Wt 174.0 lb

## 2018-12-08 DIAGNOSIS — Z01419 Encounter for gynecological examination (general) (routine) without abnormal findings: Secondary | ICD-10-CM

## 2018-12-08 DIAGNOSIS — B351 Tinea unguium: Secondary | ICD-10-CM

## 2018-12-08 DIAGNOSIS — M8580 Other specified disorders of bone density and structure, unspecified site: Secondary | ICD-10-CM

## 2018-12-08 DIAGNOSIS — N393 Stress incontinence (female) (male): Secondary | ICD-10-CM

## 2018-12-08 DIAGNOSIS — R87620 Atypical squamous cells of undetermined significance on cytologic smear of vagina (ASC-US): Secondary | ICD-10-CM | POA: Diagnosis not present

## 2018-12-08 DIAGNOSIS — Z13220 Encounter for screening for lipoid disorders: Secondary | ICD-10-CM | POA: Diagnosis not present

## 2018-12-08 DIAGNOSIS — N952 Postmenopausal atrophic vaginitis: Secondary | ICD-10-CM | POA: Diagnosis not present

## 2018-12-08 DIAGNOSIS — Z79890 Hormone replacement therapy (postmenopausal): Secondary | ICD-10-CM | POA: Diagnosis not present

## 2018-12-08 MED ORDER — ESTRADIOL 0.1 MG/24HR TD PTTW: 1.0000 | MEDICATED_PATCH | TRANSDERMAL | 4 refills | Status: DC

## 2018-12-08 MED ORDER — TAVABOROLE 5 % EX SOLN: 1.0000 [drp] | Freq: Every day | CUTANEOUS | 6 refills | Status: DC

## 2018-12-08 MED ORDER — ZOLPIDEM TARTRATE 10 MG PO TABS: ORAL_TABLET | ORAL | 5 refills | Status: DC

## 2018-12-08 NOTE — Telephone Encounter (Signed)
[facility] called regarding Tavaborole 5% solution states they can order but will need to be generic which is Kerydin 5%. She also asked what size bottle 10 ml or 4 ml? Please advise

## 2018-12-08 NOTE — Patient Instructions (Signed)
Follow-up in 1 year for annual exam, sooner if any issues. 

## 2018-12-08 NOTE — Progress Notes (Signed)
    JENNESS JABLONOWSKI [birth year] [id number]        58 y.o.  G1P0010 for annual gynecologic exam.  Several issues noted below.  Past medical history,surgical history, problem list, medications, allergies, family history and social history were all reviewed and documented as reviewed in the EPIC chart.  ROS:  Performed with pertinent positives and negatives included in the history, assessment and plan.   Additional significant findings : None   Exam: Bari Mantis assistant Vitals:   12/08/18 0959  BP: 124/80  Weight: 174 lb (78.9 kg)  Height: 5\' 6"  (1.676 m)   Body mass index is 28.08 kg/m.  General appearance:  Normal affect, orientation and appearance. Skin: Grossly normal HEENT: Without gross lesions.  No cervical or supraclavicular adenopathy. Thyroid normal.  Lungs:  Clear without wheezing, rales or rhonchi Cardiac: RR, without RMG Abdominal:  Soft, nontender, without masses, guarding, rebound, organomegaly or hernia Breasts:  Examined lying and sitting without masses, retractions, discharge or axillary adenopathy. Pelvic:  Ext, BUS, Vagina: With atrophic changes Pap smear of vaginal cuff done  Adnexa: Without masses or tenderness    Anus and perineum: Normal   Rectovaginal: Normal sphincter tone without palpated masses or tenderness.    Assessment/Plan:  58 y.o. G26P0010 female for annual gynecologic exam.  Status post LAVH 2012 for adenocarcinoma in situ and high-grade dysplasia  1. HRT.  On Vivelle 0.075 mg patches.  Notes some increases in her hot flushes and sweats.  We discussed about increasing her estrogen dose to 0.1 mg.  We again discussed the risks to include thrombosis such as stroke heart attack DVT in the breast cancer issue.  Will increase to 0.1 mg and see how she does with this.  Will call if any issues. 2. Insomnia.  Uses Ambien 10 mg intermittently for sleep as she works night shifts.  #30 with 5 refills provided. 3. Osteopenia.  DEXA 2019 T score -1.1.   Check vitamin D level today.  Plan follow-up DEXA in several years. 4. Mammography 03/2018.  Continue with annual mammography when due.  Breast exam normal today. 5. Pap smear 2019.  Pap smear done today.  History of adenocarcinoma in situ and high-grade dysplasia.  We both discussed repeat intervals and feel more comfortable with annual screening. 6. Colonoscopy 2013.  Repeat at their recommended interval. 7. Toe fungus.  Is using Tavaborole 5% solution applied directly.  Was prescribed originally by her dermatologist.  Asked if I could refill this.  She understands I have no experience with this and accepts the risks of side effects.  1 bottle with 6 refills provided 8. Stress incontinence.  Is using Poise over-the-counter continence inserts.  Ask about a trial of a continence pessary.  She will follow-up if she would like to be fitted for this.  Check baseline urine analysis. 9. Health maintenance.  Baseline CBC, CMP, lipid profile, vitamin D, hemoglobin A1c and urine analysis ordered.  Follow-up in 1 year for annual exam.   [name] MD, 10:38 AM 12/08/2018

## 2018-12-08 NOTE — Telephone Encounter (Signed)
Generic okay for 10 cc

## 2018-12-08 NOTE — Addendum Note (Signed)
Addended by: Dayna Barker on: 12/08/2018 10:57 AM   Modules accepted: Orders

## 2018-12-09 ENCOUNTER — Encounter: Payer: Self-pay | Admitting: Gynecology

## 2018-12-09 LAB — URINALYSIS, COMPLETE W/RFL CULTURE
Bacteria, UA: NONE SEEN /HPF
Bilirubin Urine: NEGATIVE
Glucose, UA: NEGATIVE
Hgb urine dipstick: NEGATIVE
Hyaline Cast: NONE SEEN /LPF
Ketones, ur: NEGATIVE
Leukocyte Esterase: NEGATIVE
Nitrites, Initial: NEGATIVE
Protein, ur: NEGATIVE
RBC / HPF: NONE SEEN /HPF (ref 0–2)
Specific Gravity, Urine: 1.028 (ref 1.001–1.03)
WBC, UA: NONE SEEN /HPF (ref 0–5)
pH: 5.5 (ref 5.0–8.0)

## 2018-12-09 LAB — CBC WITH DIFFERENTIAL/PLATELET
Absolute Monocytes: 500 cells/uL (ref 200–950)
Basophils Absolute: 37 cells/uL (ref 0–200)
Basophils Relative: 0.3 %
Eosinophils Absolute: 232 cells/uL (ref 15–500)
Eosinophils Relative: 1.9 %
HCT: 44.9 % (ref 35.0–45.0)
Hemoglobin: 15.6 g/dL — ABNORMAL HIGH (ref 11.7–15.5)
Lymphs Abs: 2623 cells/uL (ref 850–3900)
MCH: 30.1 pg (ref 27.0–33.0)
MCHC: 34.7 g/dL (ref 32.0–36.0)
MCV: 86.5 fL (ref 80.0–100.0)
MPV: 9.3 fL (ref 7.5–12.5)
Monocytes Relative: 4.1 %
Neutro Abs: 8808 cells/uL — ABNORMAL HIGH (ref 1500–7800)
Neutrophils Relative %: 72.2 %
Platelets: 371 10*3/uL (ref 140–400)
RBC: 5.19 10*6/uL — ABNORMAL HIGH (ref 3.80–5.10)
RDW: 12.2 % (ref 11.0–15.0)
Total Lymphocyte: 21.5 %
WBC: 12.2 10*3/uL — ABNORMAL HIGH (ref 3.8–10.8)

## 2018-12-09 LAB — COMPREHENSIVE METABOLIC PANEL
AG Ratio: 2 (calc) (ref 1.0–2.5)
ALT: 11 U/L (ref 6–29)
AST: 15 U/L (ref 10–35)
Albumin: 4.7 g/dL (ref 3.6–5.1)
Alkaline phosphatase (APISO): 96 U/L (ref 37–153)
BUN: 13 mg/dL (ref 7–25)
CO2: 25 mmol/L (ref 20–32)
Calcium: 9.7 mg/dL (ref 8.6–10.4)
Chloride: 103 mmol/L (ref 98–110)
Creat: 0.81 mg/dL (ref 0.50–1.05)
Globulin: 2.3 g/dL (calc) (ref 1.9–3.7)
Glucose, Bld: 94 mg/dL (ref 65–99)
Potassium: 5.1 mmol/L (ref 3.5–5.3)
Sodium: 142 mmol/L (ref 135–146)
Total Bilirubin: 0.7 mg/dL (ref 0.2–1.2)
Total Protein: 7 g/dL (ref 6.1–8.1)

## 2018-12-09 LAB — HEMOGLOBIN A1C
Hgb A1c MFr Bld: 5.6 % of total Hgb (ref <5.7)
Mean Plasma Glucose: 114 (calc)
eAG (mmol/L): 6.3 (calc)

## 2018-12-09 LAB — LIPID PANEL
Cholesterol: 193 mg/dL (ref <200)
HDL: 55 mg/dL (ref >=50)
LDL Cholesterol (Calc): 112 mg/dL (calc) — ABNORMAL HIGH
Non-HDL Cholesterol (Calc): 138 mg/dL (calc) — ABNORMAL HIGH (ref <130)
Total CHOL/HDL Ratio: 3.5 (calc) (ref <5.0)
Triglycerides: 150 mg/dL — ABNORMAL HIGH (ref <150)

## 2018-12-09 LAB — VITAMIN D 25 HYDROXY (VIT D DEFICIENCY, FRACTURES): Vit D, 25-Hydroxy: 74 ng/mL (ref 30–100)

## 2018-12-09 LAB — NO CULTURE INDICATED

## 2018-12-09 MED ORDER — TAVABOROLE 5 % EX SOLN: 1.0000 [drp] | Freq: Every day | CUTANEOUS | 6 refills | Status: AC

## 2018-12-09 NOTE — Addendum Note (Signed)
Addended by: Aura Camps on: 12/09/2018 08:07 AM   Modules accepted: Orders

## 2018-12-09 NOTE — Telephone Encounter (Signed)
Rx sent with below, note placed for okay for generic.

## 2018-12-10 ENCOUNTER — Encounter: Payer: Self-pay | Admitting: Gynecology

## 2018-12-10 NOTE — Telephone Encounter (Signed)
Her vitamin D level is in the 70s which is at the higher end of normal.  They consider less than 100 normal.  So if she has been on 10,000 units I guess she could continue to take this or she could drop down to 5000 units and I think that probably would be enough.  If she really meant 1000 units then I would stay on 1000 units daily

## 2018-12-10 NOTE — Telephone Encounter (Signed)
Dr. Audie Box I will release labs for her to view on my chart. Do you recommend 1,000 units or 2,000 units of vitamin d3 for her to take daily? Patient mention 10,000 units vitamin d ( maybe typo)

## 2018-12-13 ENCOUNTER — Encounter: Payer: Self-pay | Admitting: Gynecology

## 2018-12-13 LAB — PAP IG W/ RFLX HPV ASCU

## 2018-12-13 LAB — HUMAN PAPILLOMAVIRUS, HIGH RISK: HPV DNA High Risk: NOT DETECTED

## 2018-12-21 ENCOUNTER — Encounter: Payer: Self-pay | Admitting: Gynecology

## 2019-05-02 ENCOUNTER — Encounter: Payer: Self-pay | Admitting: *Deleted

## 2019-05-06 ENCOUNTER — Other Ambulatory Visit: Payer: Self-pay

## 2019-05-09 ENCOUNTER — Encounter: Payer: Self-pay | Admitting: Gynecology

## 2019-05-09 ENCOUNTER — Ambulatory Visit: Payer: BC Managed Care – PPO | Admitting: Gynecology

## 2019-05-09 ENCOUNTER — Other Ambulatory Visit: Payer: Self-pay

## 2019-05-09 VITALS — BP 128/80

## 2019-05-09 DIAGNOSIS — N393 Stress incontinence (female) (male): Secondary | ICD-10-CM

## 2019-05-09 NOTE — Progress Notes (Signed)
    Christina Lloyd Sep 04, 1961 [id number]        58 y.o.  G1P0010 presents for fitting with a incontinence pessary.  She continues to have issues with stress urinary incontinence.  Has been using the over-the-counter vaginal inserts and has a good response to this but would like to try the pessary.  Past medical history,surgical history, problem list, medications, allergies, family history and social history were all reviewed and documented in the EPIC chart.  Directed ROS with pertinent positives and negatives documented in the history of present illness/assessment and plan.  Exam: Christina Lloyd assistant Vitals:   05/09/19 1527  BP: 128/80   General appearance:  Normal Abdomen soft nontender without masses guarding rebound Pelvic external BUS vagina with atrophic changes.  Bimanual without masses or tenderness.  Continence pessary 65 mm (2-9/16) fitted.  The patient ambulated and ultimately voided without difficulty or discomfort from the pessary  Assessment/Plan:  58 y.o. G1P0010 with stress urinary incontinence for trial of pessary.  We will order this for her and she will return to have it placed when available.  We reviewed the anatomy and proper placement of the pessary.  Hopefully she will be able to do this herself when her pessary becomes available.    Christina Auerbach MD, 3:51 PM 05/09/2019

## 2019-05-09 NOTE — Patient Instructions (Signed)
Follow-up for pessary placement when it becomes available.

## 2019-05-16 ENCOUNTER — Encounter: Payer: Self-pay | Admitting: Gynecology

## 2019-05-19 ENCOUNTER — Other Ambulatory Visit: Payer: Self-pay | Admitting: Gynecology

## 2019-05-30 ENCOUNTER — Other Ambulatory Visit: Payer: Self-pay

## 2019-05-30 ENCOUNTER — Ambulatory Visit (INDEPENDENT_AMBULATORY_CARE_PROVIDER_SITE_OTHER): Payer: BC Managed Care – PPO | Admitting: Gynecology

## 2019-05-30 ENCOUNTER — Encounter: Payer: Self-pay | Admitting: Gynecology

## 2019-05-30 VITALS — BP 122/76

## 2019-05-30 DIAGNOSIS — Z4689 Encounter for fitting and adjustment of other specified devices: Secondary | ICD-10-CM | POA: Diagnosis not present

## 2019-05-30 NOTE — Progress Notes (Signed)
    KELIA GIBBON [birth year] [id number]        58 y.o.  G1P0010 presents for placement of her continence ring.  She has a #2,  65 mm continence ring with support  Past medical history,surgical history, problem list, medications, allergies, family history and social history were all reviewed and documented in the EPIC chart.  Directed ROS with pertinent positives and negatives documented in the history of present illness/assessment and plan.  Exam: Caryn Bee assistant Vitals:   05/30/19 1600  BP: 122/76   General appearance:  Normal External BUS vagina normal.  Continence ring was placed without difficulty.  Patient ambulated and voided without difficulty.  Assessment/Plan:  58 y.o. G1P0010 with stress urinary incontinence.  Continence ring was placed.  She will call if she has any issues with this.  We discussed possibly trying a smaller ring without support if she feels this or has difficulty removing it and replacing it herself.    Anastasio Auerbach MD, 4:26 PM 05/30/2019

## 2019-05-30 NOTE — Patient Instructions (Signed)
Call if any issue with the incontinence ring

## 2019-05-31 ENCOUNTER — Encounter: Payer: Self-pay | Admitting: Gynecology

## 2019-05-31 NOTE — Telephone Encounter (Signed)
I called patient back and spoke with her. Appt scheduled.

## 2019-05-31 NOTE — Telephone Encounter (Signed)
Have patient schedule a short appointment so we can refit her with a different ring type pessary and will try a smaller size but I would prefer to fit her and not just guess at the size.

## 2019-06-01 ENCOUNTER — Encounter: Payer: Self-pay | Admitting: Gynecology

## 2019-06-01 DIAGNOSIS — Z1231 Encounter for screening mammogram for malignant neoplasm of breast: Secondary | ICD-10-CM | POA: Diagnosis not present

## 2019-06-01 DIAGNOSIS — Z803 Family history of malignant neoplasm of breast: Secondary | ICD-10-CM | POA: Diagnosis not present

## 2019-06-02 ENCOUNTER — Other Ambulatory Visit: Payer: Self-pay

## 2019-06-03 ENCOUNTER — Ambulatory Visit: Payer: BC Managed Care – PPO | Admitting: Gynecology

## 2019-06-03 ENCOUNTER — Encounter: Payer: Self-pay | Admitting: Gynecology

## 2019-06-03 VITALS — BP 116/74

## 2019-06-03 DIAGNOSIS — Z4689 Encounter for fitting and adjustment of other specified devices: Secondary | ICD-10-CM | POA: Diagnosis not present

## 2019-06-03 DIAGNOSIS — N393 Stress incontinence (female) (male): Secondary | ICD-10-CM

## 2019-06-03 NOTE — Patient Instructions (Signed)
Office will call when the pessary is available for placement.

## 2019-06-03 NOTE — Progress Notes (Signed)
    Christina Lloyd [birth year] [id number]        58 y.o.  G1P0010 presents having recently been fitted with a continence pessary.  She felt a lot of pressure and discomfort into the rectum although notes that she was continent and able to void easily with the pessary.  She removed the pessary but is unable to replace it.  Past medical history,surgical history, problem list, medications, allergies, family history and social history were all reviewed and documented in the EPIC chart.  Directed ROS with pertinent positives and negatives documented in the history of present illness/assessment and plan.  Exam: Caryn Bee assistant Vitals:   06/03/19 1440  BP: 116/74   General appearance:  Normal Abdomen soft nontender without masses guarding rebound Pelvic external BUS vagina normal with mild atrophic changes.  Bimanual without masses or tenderness.  Patient fitted with a # 2 ring pessary.  Assessment/Plan:  58 y.o. G1P0010 with stress incontinence with good response to pessary but unfortunately he is having some discomfort with the pessary.  We fitted her with a #2 ring pessary and we will go ahead and order her incontinence pessary with a pure ring and no support and see if this is better tolerated from a comfort standpoint.  She will follow-up for placement once this is available.    Anastasio Auerbach MD, 3:09 PM 06/03/2019

## 2019-06-15 ENCOUNTER — Other Ambulatory Visit: Payer: Self-pay

## 2019-06-16 ENCOUNTER — Ambulatory Visit: Payer: BC Managed Care – PPO | Admitting: Gynecology

## 2019-06-16 ENCOUNTER — Encounter: Payer: Self-pay | Admitting: Gynecology

## 2019-06-16 VITALS — BP 118/76

## 2019-06-16 DIAGNOSIS — R32 Unspecified urinary incontinence: Secondary | ICD-10-CM

## 2019-06-16 DIAGNOSIS — E559 Vitamin D deficiency, unspecified: Secondary | ICD-10-CM | POA: Diagnosis not present

## 2019-06-16 DIAGNOSIS — Z4689 Encounter for fitting and adjustment of other specified devices: Secondary | ICD-10-CM | POA: Diagnosis not present

## 2019-06-16 NOTE — Patient Instructions (Signed)
Follow up in 1 month for pessary check

## 2019-06-16 NOTE — Progress Notes (Signed)
    Christina Lloyd [birth year] [id number]        58 y.o.  G1P0010 presents for placement of a new continence pessary.  Had a prior pessary placed but she felt discomfort with that we fitted her #2 ring continence pessary.  She also had questions about vitamin D supplementation.  History of osteopenia with low vitamin D's.  Has been supplementing daily and asked about the appropriate dose.  Past medical history,surgical history, problem list, medications, allergies, family history and social history were all reviewed and documented in the EPIC chart.  Directed ROS with pertinent positives and negatives documented in the history of present illness/assessment and plan.  Exam: Caryn Bee assistant Vitals:   06/16/19 1434  BP: 118/76   General appearance:  Normal External BUS vagina grossly normal with atrophic changes.  Ring pessary was placed.  The patient ambulated and subsequently voided with the pessary in place with continence with straining.  Feels comfortable to the patient.  Assessment/Plan:  58 y.o. G1P0010 with urinary incontinence.  Newly fitted ring pessary.  We will see how she does with this.  She will follow-up in 1 month.  Sooner if any issues.  We discussed vitamin D supplementation.  She does have a history of osteopenia.  Will check vitamin D level today to be able to make better recommendations.    Anastasio Auerbach MD, 3:07 PM 06/16/2019

## 2019-06-17 ENCOUNTER — Encounter: Payer: Self-pay | Admitting: Gynecology

## 2019-06-17 LAB — VITAMIN D 25 HYDROXY (VIT D DEFICIENCY, FRACTURES): Vit D, 25-Hydroxy: 85 ng/mL (ref 30–100)

## 2019-06-21 ENCOUNTER — Other Ambulatory Visit: Payer: Self-pay

## 2019-06-21 DIAGNOSIS — E559 Vitamin D deficiency, unspecified: Secondary | ICD-10-CM

## 2019-06-21 NOTE — Telephone Encounter (Signed)
If she wants to take the cod liver oil instead of the 10,000 units vitamin D I think that is fine.  I would recommend repeating a vitamin D level on this new regiment in 6 months to see where she stands.

## 2019-06-29 ENCOUNTER — Encounter: Payer: Self-pay | Admitting: Gynecology

## 2019-07-12 ENCOUNTER — Encounter: Payer: Self-pay | Admitting: Gynecology

## 2019-07-14 ENCOUNTER — Other Ambulatory Visit: Payer: Self-pay

## 2019-07-15 ENCOUNTER — Ambulatory Visit (INDEPENDENT_AMBULATORY_CARE_PROVIDER_SITE_OTHER): Payer: BC Managed Care – PPO | Admitting: Gynecology

## 2019-07-15 ENCOUNTER — Telehealth: Payer: Self-pay

## 2019-07-15 ENCOUNTER — Other Ambulatory Visit: Payer: Self-pay

## 2019-07-15 ENCOUNTER — Encounter: Payer: Self-pay | Admitting: Gynecology

## 2019-07-15 VITALS — BP 130/84

## 2019-07-15 DIAGNOSIS — Z4689 Encounter for fitting and adjustment of other specified devices: Secondary | ICD-10-CM | POA: Diagnosis not present

## 2019-07-15 DIAGNOSIS — N393 Stress incontinence (female) (male): Secondary | ICD-10-CM | POA: Diagnosis not present

## 2019-07-15 DIAGNOSIS — N952 Postmenopausal atrophic vaginitis: Secondary | ICD-10-CM

## 2019-07-15 MED ORDER — TRIMO-SAN 0.025 % VA GEL: 1.0000 | VAGINAL | 3 refills | Status: DC

## 2019-07-15 MED ORDER — ESTRADIOL 0.1 MG/GM VA CREA: 1.0000 | TOPICAL_CREAM | VAGINAL | 12 refills | Status: DC

## 2019-07-15 MED ORDER — ESTRADIOL 0.1 MG/GM VA CREA: TOPICAL_CREAM | VAGINAL | 4 refills | Status: DC

## 2019-07-15 NOTE — Telephone Encounter (Signed)
Pharmacist called to question the Rx sent and how long a tube should last since 12 refills placed on Rx.  I resent with instructions that are in chart--to apply to labia externally twice weekly. They provided the same 42.5 gram tube and 4 refills were placed on it.

## 2019-07-15 NOTE — Patient Instructions (Signed)
Follow-up if any issues with the pessary or vaginal estrogen cream.

## 2019-07-15 NOTE — Progress Notes (Signed)
    Christina Lloyd [birth year] [id number]        58 y.o.  G1P0010 presents for 1 month follow-up for her incontinence pessary.  Notes that she is doing great.  She is able to remove it herself and she does so every 3 days and rests 1 day and then replaces it.  She is having good continence with it.  She noticed some atrophy of her labia postmenopausally and this bothers her and asked if estrogen supplement would help with this.  She is on estradiol 0.1 mg patches.  She is using the [facility] vaginal cream became with her pessary intravaginally twice weekly and it seems to help with vaginal odor.  Past medical history,surgical history, problem list, medications, allergies, family history and social history were all reviewed and documented in the EPIC chart.  Directed ROS with pertinent positives and negatives documented in the history of present illness/assessment and plan.  Exam: Caryn Bee assistant Vitals:   07/15/19 1455  BP: 130/84   General appearance:  Normal Abdomen soft nontender without masses guarding rebound Pelvic external BUS vagina with atrophic changes.  The continence pessary was removed.  Vaginal mucosa without evidence of irritation or erosion.  Bimanual exam without masses or tenderness.  Continence pessary replaced without difficulty.  Assessment/Plan:  58 y.o. G1P0010 with good response using a continence pessary.  I refilled her Trimo San at her request.  We discussed whether adding vaginal estradiol cream externally twice weekly would help with labial atrophy.  She is on the estradiol patch but supplementing with vaginal estrogen may help.  We discussed the absorption issue and whether even if she absorbs it with that add any significance to her estradiol serum levels and whether this would add any risk.  Ultimately the patient would like to try and I prescribed estradiol vaginal cream to apply externally twice weekly.  Assuming she does well with this then she will  follow-up in 6 months when she is due for return, sooner if any issues.    Anastasio Auerbach MD, 3:11 PM 07/15/2019

## 2019-08-29 DIAGNOSIS — H2513 Age-related nuclear cataract, bilateral: Secondary | ICD-10-CM | POA: Diagnosis not present

## 2019-08-29 DIAGNOSIS — H5203 Hypermetropia, bilateral: Secondary | ICD-10-CM | POA: Diagnosis not present

## 2019-11-04 ENCOUNTER — Other Ambulatory Visit: Payer: Self-pay

## 2019-11-04 MED ORDER — ZOLPIDEM TARTRATE 10 MG PO TABS: ORAL_TABLET | ORAL | 1 refills | Status: DC

## 2019-11-04 NOTE — Telephone Encounter (Signed)
Former TF patient. Last annual 12/08/18 with TF.  Scheduled to see Dr. Penni Bombard on 01/04/20 for annual exam.

## 2019-12-14 ENCOUNTER — Other Ambulatory Visit: Payer: Self-pay

## 2019-12-15 ENCOUNTER — Encounter: Payer: Self-pay | Admitting: Obstetrics and Gynecology

## 2019-12-15 ENCOUNTER — Ambulatory Visit (INDEPENDENT_AMBULATORY_CARE_PROVIDER_SITE_OTHER): Payer: 59 | Admitting: Obstetrics and Gynecology

## 2019-12-15 ENCOUNTER — Encounter: Payer: BLUE CROSS/BLUE SHIELD | Admitting: Gynecology

## 2019-12-15 VITALS — BP 118/76 | Ht 65.5 in | Wt 175.0 lb

## 2019-12-15 DIAGNOSIS — Z4689 Encounter for fitting and adjustment of other specified devices: Secondary | ICD-10-CM

## 2019-12-15 DIAGNOSIS — Z79890 Hormone replacement therapy (postmenopausal): Secondary | ICD-10-CM | POA: Diagnosis not present

## 2019-12-15 DIAGNOSIS — Z13220 Encounter for screening for lipoid disorders: Secondary | ICD-10-CM

## 2019-12-15 DIAGNOSIS — Z1272 Encounter for screening for malignant neoplasm of vagina: Secondary | ICD-10-CM

## 2019-12-15 DIAGNOSIS — Z01419 Encounter for gynecological examination (general) (routine) without abnormal findings: Secondary | ICD-10-CM

## 2019-12-15 DIAGNOSIS — N898 Other specified noninflammatory disorders of vagina: Secondary | ICD-10-CM | POA: Diagnosis not present

## 2019-12-15 DIAGNOSIS — N393 Stress incontinence (female) (male): Secondary | ICD-10-CM

## 2019-12-15 DIAGNOSIS — Z8639 Personal history of other endocrine, nutritional and metabolic disease: Secondary | ICD-10-CM | POA: Diagnosis not present

## 2019-12-15 DIAGNOSIS — N952 Postmenopausal atrophic vaginitis: Secondary | ICD-10-CM

## 2019-12-15 DIAGNOSIS — Z8742 Personal history of other diseases of the female genital tract: Secondary | ICD-10-CM

## 2019-12-15 DIAGNOSIS — E559 Vitamin D deficiency, unspecified: Secondary | ICD-10-CM | POA: Diagnosis not present

## 2019-12-15 DIAGNOSIS — M2550 Pain in unspecified joint: Secondary | ICD-10-CM

## 2019-12-15 DIAGNOSIS — Z124 Encounter for screening for malignant neoplasm of cervix: Secondary | ICD-10-CM

## 2019-12-15 DIAGNOSIS — M8580 Other specified disorders of bone density and structure, unspecified site: Secondary | ICD-10-CM

## 2019-12-15 LAB — WET PREP FOR TRICH, YEAST, CLUE

## 2019-12-15 MED ORDER — ZOLPIDEM TARTRATE 10 MG PO TABS: ORAL_TABLET | ORAL | 5 refills | Status: DC

## 2019-12-15 MED ORDER — ESTRADIOL 0.1 MG/24HR TD PTTW: 1.0000 | MEDICATED_PATCH | TRANSDERMAL | 4 refills | Status: DC

## 2019-12-15 NOTE — Progress Notes (Signed)
Christina Lloyd 1961-09-01 [id number]  SUBJECTIVE:  59 y.o. G1P0010 female for annual routine gynecologic exam and Pap smear. She has no gynecologic concerns. She has been having some swelling in her joints and generalized arthralgias. She has been using her pessary with good result.  She is able to remove it on her own as needed. She had a vaginal yeast infection with increased discharge and some itching in the vulvar area and leg skin.  She did self treat with Monistat cream with slight improvement in her symptoms.  Current Outpatient Medications  Medication Sig Dispense Refill  . Acetaminophen (TYLENOL PO) Take by mouth.    . estradiol (VIVELLE-DOT) 0.1 MG/24HR patch Place 1 patch (0.1 mg total) onto the skin 2 (two) times a week. 24 patch 4  . fluticasone (FLONASE) 50 MCG/ACT nasal spray Place into both nostrils daily.    Marland Kitchen loratadine (CLARITIN) 10 MG tablet Take 10 mg by mouth daily.    Levin Erp SULFATE VAGINAL (TRIMO-SAN) 0.025 % GEL Place 1 applicator vaginally 2 (two) times a week. 113.4 g 3  . Tavaborole 5 % SOLN Apply 1 drop topically daily. 10 mL 6  . VITAMIN D PO Take 5,000 Units by mouth.     . zolpidem (AMBIEN) 10 MG tablet TAKE ONE TABLET BY MOUTH EVERY NIGHT AT BEDTIME AS NEEDED FOR SLEEP 30 tablet 1   No current facility-administered medications for this visit.   Allergies: Penicillins and Nsaids  No LMP recorded. Patient has had a hysterectomy.  Past medical history,surgical history, problem list, medications, allergies, family history and social history were all reviewed and documented as reviewed in the EPIC chart.  ROS:  Feeling well. No dyspnea or chest pain on exertion.  No abdominal pain, change in bowel habits, black or bloody stools.  No urinary tract symptoms. GYN ROS: normal menses, no abnormal bleeding, pelvic pain or discharge, no breast pain or new or enlarging lumps on self exam. No neurological complaints.    OBJECTIVE:  Ht 5' 5.5" (1.664 m)    Wt 175 lb (79.4 kg)   BMI 28.68 kg/m  The patient appears well, alert, oriented x 3, in no distress. ENT normal.  Neck supple. No cervical or supraclavicular adenopathy or thyromegaly.  Lungs are clear, good air entry, no wheezes, rhonchi or rales. S1 and S2 normal, no murmurs, regular rate and rhythm.  Abdomen soft without tenderness, guarding, mass or organomegaly.  Neurological is normal, no focal findings.  BREAST EXAM: breasts appear normal, no suspicious masses, no skin or nipple changes or axillary nodes  PELVIC EXAM: VULVA: normal appearing vulva with no masses, tenderness or lesions, VAGINA: normal appearing vagina with normal color and discharge, no lesions, no erosions or ulceration, CERVIX: surgically absent, UTERUS: surgically absent, vaginal cuff well healed, ADNEXA: normal adnexa in size, nontender and no masses, PAP: Pap smear done today, thin-prep method, WET MOUNT done - results: negative for pathogens, normal epithelial cells  Chaperone: Caryn Bee present during the examination  ASSESSMENT:  59 y.o. G1P0010 here for annual gynecologic exam  PLAN:   1. Postmenopausal/HRT.  Vivielle 0.1 mg patch, dose was increased last year.  She is doing well at this dose.  Added vaginal estradiol cream last year for labial atrophy discomfort.  She was using the cream for a while but did stop.  She is concerned about flattening and dryness over the vulvar areas due to discomfort with that.  She would like to resume the cream and has a  supply, I recommended using it every other night for the month and then backing off to once or twice per week after that.  Risks of thrombotic disease, including heart attack, stroke, DVT, PE, increased risk for breast cancer are reviewed.  She had a prior LAVH in 2012 for adenocarcinoma in situ and high-grade dysplasia. 2. Insomnia.  Ambien 10 mg nightly, dispense 30 tablets, 5 refills ordered. 3. Pap smear/HPV 2020 with ASCUS/negative HPV result.  Pap  smear repeated today.  If abnormal, will have to do vaginal colposcopy.  History of adenocarcinoma in situ and high-grade dysplasia. Continue annual screening. 4. Mammogram 05/2019.  Normal breast exam today.  She is reminded to schedule an annual mammogram this year when due. 5. Colonoscopy 2013.  Recommended that she follow up at the recommended interval.   6. Osteopenia.  DEXA 2019 T-score -1.1.  Vitamin D level normal 06/2019, rechecking today due to history of previous deficiency.  Next DEXA recommended this year or next year so plan to have her schedule this. 7. Urinary incontinence.  Pessary care #2, 65 mm continence ring.  Able to insert and remove on her own providing maintenance.  Normal exam today.  She finds the pessary is working well for her. 8.  Vaginal discharge/irritation.  Negative vaginal wet mount today.  She may be experiencing the skin inflammation and I recommended using topical 1% over-the-counter hydrocortisone on the external vulvar areas and leg skin as needed, if the symptoms or not improving, she should let us know. 9. Health maintenance.  She will proceed to lab today for routine screening blood work (lipids, CBC, CMP, hemoglobin A1c).  TSH will be checked due to history of thyroiditis, ESR due to her arthralgias.  Vitamin D level checked due to history of deficiency.  Return annually or sooner, prn.  Joseph Pierini MD  12/15/19

## 2019-12-15 NOTE — Patient Instructions (Signed)
We will plan to repeat the DEXA/bone density scan this year or next.  Continue weight bearing exercise, and vitamin D/calcium intake. Please consider scheduling this study.

## 2019-12-16 LAB — COMPREHENSIVE METABOLIC PANEL
AG Ratio: 2.4 (calc) (ref 1.0–2.5)
ALT: 11 U/L (ref 6–29)
AST: 13 U/L (ref 10–35)
Albumin: 4.5 g/dL (ref 3.6–5.1)
Alkaline phosphatase (APISO): 79 U/L (ref 37–153)
BUN: 12 mg/dL (ref 7–25)
CO2: 24 mmol/L (ref 20–32)
Calcium: 9.3 mg/dL (ref 8.6–10.4)
Chloride: 105 mmol/L (ref 98–110)
Creat: 0.67 mg/dL (ref 0.50–1.05)
Globulin: 1.9 g/dL (calc) (ref 1.9–3.7)
Glucose, Bld: 98 mg/dL (ref 65–99)
Potassium: 4.2 mmol/L (ref 3.5–5.3)
Sodium: 139 mmol/L (ref 135–146)
Total Bilirubin: 0.6 mg/dL (ref 0.2–1.2)
Total Protein: 6.4 g/dL (ref 6.1–8.1)

## 2019-12-16 LAB — LIPID PANEL
Cholesterol: 175 mg/dL (ref <200)
HDL: 44 mg/dL — ABNORMAL LOW (ref >=50)
LDL Cholesterol (Calc): 105 mg/dL (calc) — ABNORMAL HIGH
Non-HDL Cholesterol (Calc): 131 mg/dL (calc) — ABNORMAL HIGH (ref <130)
Total CHOL/HDL Ratio: 4 (calc) (ref <5.0)
Triglycerides: 149 mg/dL (ref <150)

## 2019-12-16 LAB — CBC
HCT: 42.5 % (ref 35.0–45.0)
Hemoglobin: 14.8 g/dL (ref 11.7–15.5)
MCH: 30.2 pg (ref 27.0–33.0)
MCHC: 34.8 g/dL (ref 32.0–36.0)
MCV: 86.7 fL (ref 80.0–100.0)
MPV: 9.4 fL (ref 7.5–12.5)
Platelets: 310 10*3/uL (ref 140–400)
RBC: 4.9 10*6/uL (ref 3.80–5.10)
RDW: 12.4 % (ref 11.0–15.0)
WBC: 9.2 10*3/uL (ref 3.8–10.8)

## 2019-12-16 LAB — HEMOGLOBIN A1C
Hgb A1c MFr Bld: 5.7 % of total Hgb — ABNORMAL HIGH (ref <5.7)
Mean Plasma Glucose: 117 (calc)
eAG (mmol/L): 6.5 (calc)

## 2019-12-16 LAB — TSH: TSH: 1.19 mIU/L (ref 0.40–4.50)

## 2019-12-16 LAB — VITAMIN D 25 HYDROXY (VIT D DEFICIENCY, FRACTURES): Vit D, 25-Hydroxy: 105 ng/mL — ABNORMAL HIGH (ref 30–100)

## 2019-12-16 LAB — SEDIMENTATION RATE: Sed Rate: 6 mm/h (ref 0–30)

## 2019-12-20 LAB — PAP IG W/ RFLX HPV ASCU

## 2019-12-20 LAB — HUMAN PAPILLOMAVIRUS, HIGH RISK: HPV DNA High Risk: DETECTED — AB

## 2019-12-21 ENCOUNTER — Encounter: Payer: Self-pay | Admitting: Obstetrics and Gynecology

## 2019-12-21 NOTE — Telephone Encounter (Signed)
Addressed in separate lab result note.

## 2020-01-04 ENCOUNTER — Other Ambulatory Visit: Payer: Self-pay

## 2020-01-05 ENCOUNTER — Encounter: Payer: Self-pay | Admitting: Obstetrics and Gynecology

## 2020-01-05 ENCOUNTER — Ambulatory Visit (INDEPENDENT_AMBULATORY_CARE_PROVIDER_SITE_OTHER): Payer: 59 | Admitting: Obstetrics and Gynecology

## 2020-01-05 VITALS — BP 122/76

## 2020-01-05 DIAGNOSIS — N891 Moderate vaginal dysplasia: Secondary | ICD-10-CM

## 2020-01-05 DIAGNOSIS — N9089 Other specified noninflammatory disorders of vulva and perineum: Secondary | ICD-10-CM

## 2020-01-05 DIAGNOSIS — R87811 Vaginal high risk human papillomavirus (HPV) DNA test positive: Secondary | ICD-10-CM

## 2020-01-05 DIAGNOSIS — R87610 Atypical squamous cells of undetermined significance on cytologic smear of cervix (ASC-US): Secondary | ICD-10-CM

## 2020-01-05 DIAGNOSIS — D069 Carcinoma in situ of cervix, unspecified: Secondary | ICD-10-CM

## 2020-01-05 DIAGNOSIS — Z86001 Personal history of in-situ neoplasm of cervix uteri: Secondary | ICD-10-CM

## 2020-01-05 NOTE — Progress Notes (Signed)
   Christina Lloyd  03/13/1961 [id number]  HPI The patient is a 59 y.o. G1P0010 who presents today for a vaginal colposcopy exam.  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  Her initial Pap smear allowing the Pap smear indicated LGSIL, the Pap smear 10/25/2013 indicated ASCUS, and vaginal biopsy indicated VAIN-1, the following annual Pap smears were then normal cytology until 2020 when she had an ASCUS result with negative HPV.  Her Pap smear 12/15/2019 indicated ASCUS with positive high-risk HPV.   Physical Exam  BP 122/76   Physical Exam Genitourinary:      Procedure note Vaginal colposcopy and vulvoscopy Dilute acetic acid cleanse is applied to the vaginal surfaces.  Lugol's solution was applied to vaginal surfaces.  Close inspection of the vaginal cavity including the sidewalls and apices indicated no acetowhite changes, no masses, no lesions.  The acetic acid was also applied to the anterior and posterior vaginal surfaces as the speculum was withdrawn and there were no acetowhite changes on those surfaces.  Epithelium appeared completely normal. Acetic acid was applied to the vaginal introitus and inner side of the labia minora and there was a V shaped configuration of acetowhite change along the vaginal introitus fairly symmetrically bilaterally in addition to 3-4 wartlike structures on the left posterior lateral vaginal introitus. 1 mL of 1% lidocaine was injected into the subcutaneous tissue underlying the epithelium at the border of the acetowhite change transitioning to the normal vulvar epithelium, in addition to undermining one of the wart-like structures.  Adequate anesthesia was confirmed.  A 4 mm punch biopsy was taken at the border of the acetowhite change over the left posterior vaginal introitus/inner labia minora, and collected as a specimen and sent to pathology.  One of the wart light  structures was grasped with the pickups and shaved off of the epithelium with an 11 blade scalpel and collected as a specimen and sent to pathology.  Hemostasis was obtained with application of silver nitrate cautery.  Patient tolerated the procedure well.  [name] was present for the exam and procedure.  I recommended that she leave the pessary out for 1 week to allow for vulvar healing and then she can reinsert it as desired after that.    Theresia Majors MD, FACOG 01/05/20

## 2020-01-05 NOTE — Patient Instructions (Signed)
Please leave the pessary out for the next week while the vulvar biopsy site heals.  You can then reinsert the pessary as desired.

## 2020-01-09 LAB — TISSUE PATH REPORT

## 2020-01-09 LAB — PATHOLOGY REPORT

## 2020-01-11 ENCOUNTER — Encounter: Payer: Self-pay | Admitting: Obstetrics and Gynecology

## 2020-01-30 ENCOUNTER — Other Ambulatory Visit: Payer: Self-pay

## 2020-02-02 ENCOUNTER — Other Ambulatory Visit: Payer: Self-pay

## 2020-02-08 ENCOUNTER — Other Ambulatory Visit: Payer: Self-pay

## 2020-02-08 MED ORDER — ESTRADIOL 0.1 MG/24HR TD PTTW: 1.0000 | MEDICATED_PATCH | TRANSDERMAL | 4 refills | Status: DC

## 2020-04-24 ENCOUNTER — Other Ambulatory Visit: Payer: Self-pay

## 2020-04-24 ENCOUNTER — Ambulatory Visit: Payer: 59

## 2020-06-27 ENCOUNTER — Emergency Department (HOSPITAL_COMMUNITY): Payer: 59

## 2020-06-27 ENCOUNTER — Other Ambulatory Visit: Payer: Self-pay

## 2020-06-27 ENCOUNTER — Observation Stay (HOSPITAL_COMMUNITY)
Admission: EM | Admit: 2020-06-27 | Discharge: 2020-06-28 | Disposition: A | Discharge status: Home / Self Care | Payer: 59 | Attending: Internal Medicine | Admitting: Internal Medicine

## 2020-06-27 ENCOUNTER — Ambulatory Visit (HOSPITAL_COMMUNITY)
Admission: EM | Admit: 2020-06-27 | Discharge: 2020-06-27 | Disposition: A | Discharge status: Nursing Home (Includes ICF) | Payer: Self-pay

## 2020-06-27 DIAGNOSIS — R4781 Slurred speech: Secondary | ICD-10-CM

## 2020-06-27 DIAGNOSIS — Z7982 Long term (current) use of aspirin: Secondary | ICD-10-CM | POA: Insufficient documentation

## 2020-06-27 DIAGNOSIS — Z20822 Contact with and (suspected) exposure to covid-19: Secondary | ICD-10-CM | POA: Diagnosis not present

## 2020-06-27 DIAGNOSIS — I639 Cerebral infarction, unspecified: Principal | ICD-10-CM

## 2020-06-27 DIAGNOSIS — Z87891 Personal history of nicotine dependence: Secondary | ICD-10-CM | POA: Diagnosis not present

## 2020-06-27 DIAGNOSIS — Z79899 Other long term (current) drug therapy: Secondary | ICD-10-CM | POA: Diagnosis not present

## 2020-06-27 LAB — DIFFERENTIAL
Abs Immature Granulocytes: 0.03 10*3/uL (ref 0.00–0.07)
Basophils Absolute: 0 10*3/uL (ref 0.0–0.1)
Basophils Relative: 0 %
Eosinophils Absolute: 0.1 10*3/uL (ref 0.0–0.5)
Eosinophils Relative: 1 %
Immature Granulocytes: 0 %
Lymphocytes Relative: 21 %
Lymphs Abs: 2.5 10*3/uL (ref 0.7–4.0)
Monocytes Absolute: 0.5 10*3/uL (ref 0.1–1.0)
Monocytes Relative: 4 %
Neutro Abs: 8.5 10*3/uL — ABNORMAL HIGH (ref 1.7–7.7)
Neutrophils Relative %: 74 %

## 2020-06-27 LAB — CBC
HCT: 46.1 % — ABNORMAL HIGH (ref 36.0–46.0)
Hemoglobin: 14.7 g/dL (ref 12.0–15.0)
MCH: 29.2 pg (ref 26.0–34.0)
MCHC: 31.9 g/dL (ref 30.0–36.0)
MCV: 91.7 fL (ref 80.0–100.0)
Platelets: 327 10*3/uL (ref 150–400)
RBC: 5.03 MIL/uL (ref 3.87–5.11)
RDW: 12.8 % (ref 11.5–15.5)
WBC: 11.6 10*3/uL — ABNORMAL HIGH (ref 4.0–10.5)
nRBC: 0 % (ref 0.0–0.2)

## 2020-06-27 LAB — SARS CORONAVIRUS 2 BY RT PCR (HOSPITAL ORDER, PERFORMED IN ~~LOC~~ HOSPITAL LAB): SARS Coronavirus 2: NEGATIVE

## 2020-06-27 LAB — COMPREHENSIVE METABOLIC PANEL
ALT: 13 U/L (ref 0–44)
AST: 17 U/L (ref 15–41)
Albumin: 4.2 g/dL (ref 3.5–5.0)
Alkaline Phosphatase: 82 U/L (ref 38–126)
Anion gap: 11 (ref 5–15)
BUN: 7 mg/dL (ref 6–20)
CO2: 24 mmol/L (ref 22–32)
Calcium: 8.9 mg/dL (ref 8.9–10.3)
Chloride: 106 mmol/L (ref 98–111)
Creatinine, Ser: 0.62 mg/dL (ref 0.44–1.00)
GFR calc Af Amer: >60 mL/min (ref >60)
GFR calc non Af Amer: >60 mL/min (ref >60)
Glucose, Bld: 104 mg/dL — ABNORMAL HIGH (ref 70–99)
Potassium: 3.6 mmol/L (ref 3.5–5.1)
Sodium: 141 mmol/L (ref 135–145)
Total Bilirubin: 0.7 mg/dL (ref 0.3–1.2)
Total Protein: 6.7 g/dL (ref 6.5–8.1)

## 2020-06-27 LAB — I-STAT CHEM 8, ED
BUN: 7 mg/dL (ref 6–20)
Calcium, Ion: 1.06 mmol/L — ABNORMAL LOW (ref 1.15–1.40)
Chloride: 106 mmol/L (ref 98–111)
Creatinine, Ser: 0.5 mg/dL (ref 0.44–1.00)
Glucose, Bld: 99 mg/dL (ref 70–99)
HCT: 46 % (ref 36.0–46.0)
Hemoglobin: 15.6 g/dL — ABNORMAL HIGH (ref 12.0–15.0)
Potassium: 3.7 mmol/L (ref 3.5–5.1)
Sodium: 141 mmol/L (ref 135–145)
TCO2: 24 mmol/L (ref 22–32)

## 2020-06-27 LAB — APTT: aPTT: 29 seconds (ref 24–36)

## 2020-06-27 LAB — CBG MONITORING, ED: Glucose-Capillary: 102 mg/dL — ABNORMAL HIGH (ref 70–99)

## 2020-06-27 LAB — PROTIME-INR
INR: 0.9 (ref 0.8–1.2)
Prothrombin Time: 12.2 seconds (ref 11.4–15.2)

## 2020-06-27 MED ORDER — ASPIRIN EC 81 MG PO TBEC
81.0000 mg | DELAYED_RELEASE_TABLET | Freq: Every day | ORAL | Status: DC
  Administered 2020-06-28: 81 mg via ORAL
  Filled 2020-06-27: qty 1

## 2020-06-27 MED ORDER — STROKE: EARLY STAGES OF RECOVERY BOOK: Freq: Once | Status: DC

## 2020-06-27 MED ORDER — IOHEXOL 350 MG/ML SOLN
75.0000 mL | Freq: Once | INTRAVENOUS | Status: AC | PRN
  Administered 2020-06-27: 75 mL via INTRAVENOUS

## 2020-06-27 MED ORDER — ACETAMINOPHEN 160 MG/5ML PO SOLN: 650.0000 mg | ORAL | Status: DC | PRN

## 2020-06-27 MED ORDER — CLOPIDOGREL BISULFATE 75 MG PO TABS
75.0000 mg | ORAL_TABLET | Freq: Every day | ORAL | Status: DC
  Administered 2020-06-28: 75 mg via ORAL
  Filled 2020-06-27: qty 1

## 2020-06-27 MED ORDER — ZOLPIDEM TARTRATE 5 MG PO TABS
10.0000 mg | ORAL_TABLET | Freq: Every day | ORAL | Status: DC
  Administered 2020-06-28: 10 mg via ORAL
  Filled 2020-06-27: qty 2

## 2020-06-27 MED ORDER — ASPIRIN 325 MG PO TABS
325.0000 mg | ORAL_TABLET | Freq: Once | ORAL | Status: AC
  Administered 2020-06-28: 325 mg via ORAL
  Filled 2020-06-27: qty 1

## 2020-06-27 MED ORDER — ACETAMINOPHEN 650 MG RE SUPP: 650.0000 mg | RECTAL | Status: DC | PRN

## 2020-06-27 MED ORDER — ACETAMINOPHEN 325 MG PO TABS
650.0000 mg | ORAL_TABLET | Freq: Once | ORAL | Status: AC
  Administered 2020-06-27: 650 mg via ORAL
  Filled 2020-06-27: qty 2

## 2020-06-27 MED ORDER — ACETAMINOPHEN 325 MG PO TABS
650.0000 mg | ORAL_TABLET | ORAL | Status: DC | PRN
  Administered 2020-06-28 (×2): 650 mg via ORAL
  Filled 2020-06-27 (×2): qty 2

## 2020-06-27 MED ORDER — AMLODIPINE BESYLATE 5 MG PO TABS
5.0000 mg | ORAL_TABLET | Freq: Every day | ORAL | Status: DC
  Administered 2020-06-28: 5 mg via ORAL
  Filled 2020-06-27: qty 1

## 2020-06-27 MED ORDER — SENNOSIDES-DOCUSATE SODIUM 8.6-50 MG PO TABS: 1.0000 | ORAL_TABLET | Freq: Every evening | ORAL | Status: DC | PRN

## 2020-06-27 NOTE — ED Notes (Signed)
Patient is being discharged from the Urgent Care and sent to the Emergency Department via [facility] Per Dr. Tracie Harrier, patient is in need of higher level of care due to CVA r/o. Patient is aware and verbalizes understanding of plan of care.  Vitals:   06/27/20 0947  BP: (!) 168/98  Pulse: (!) 113  Resp: 18  Temp: 98.9 F (37.2 C)  SpO2: 98%

## 2020-06-27 NOTE — ED Triage Notes (Signed)
Pt c/o lip and teeth tingling, left 1st and 2nd digit of left hand tingling, slurred speech and drooling. Pt LKW 06/26/2020 at 2015. NIH 1 for left sided facial droop.

## 2020-06-27 NOTE — ED Provider Notes (Signed)
[facility] EMERGENCY DEPARTMENT Provider Note   CSN: [id number] Arrival date & time: 06/27/20  7322     History Chief Complaint  Patient presents with  . Facial Droop  . Aphasia    Christina Lloyd is a 59 y.o. female with a past medical history of PVCs, ocular migraines, who presents today for evaluation of slurred speech.  She reports that at about 815 last night she developed tingling/paresthesias on fingers on her left hand and on her lips.  Then it 9:15 PM last night she developed slurred speech and left-sided facial droop.  She feels like her speech is improved however still not back to normal.  She continues to have paresthesias in her left hand.  She originally went to urgent care to try and get a same-day neurology appointment however was directed here.  Patient is a former Char[employer] fundraiser.  He states that when her symptoms started she was not having any weakness, that her grip strength and vision were intact and her gait is normal.  She reports she feels like her coordination in her left hand is slightly off however has not been dropping things.    She did recently travel to [place] for a wedding.  She states that she got the J&J vaccine in June or July.   No neck pain, recent trauma, chest pain, shortness of breath.  No recent chiropractic adjustments.  HPI     Past Medical History:  Diagnosis Date  . Adenocarcinoma in situ (AIS) of uterine cervix 2012  . Blepharitis   . History of shingles   . Ocular migraine   . Osteopenia 08/2018   T score -1.1 FRAX 22% / 0.7%  . Proctitis   . PVC (premature ventricular contraction)   . Urinary incontinence   . 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 Subclinical hyperthyroidism 05/08/2016    Past Surgical History:  Procedure Laterality Date  . GALLBLADDER SURGERY    . LAVH  2012   AIS, HGSIL   . PILONIDAL CYST EXCISION    . VAGINAL HYSTERECTOMY       OB History    Gravida  1   Para      Term      Preterm      AB  1   Living  0     SAB      TAB      Ectopic      Multiple      Live Births              Family History  Problem Relation Age of Onset  . Heart disease Mother   . Alzheimer's disease Mother   . Heart disease Father   . Diabetes Father   . Heart disease Maternal Grandmother   . Diabetes Maternal Grandmother   . Heart disease Maternal Grandfather   . Diabetes Maternal Grandfather   . Stroke Paternal Grandmother   . Breast cancer Paternal Grandmother 53  . Stroke Paternal Grandfather   . Thyroid disease Brother     Social History   Tobacco Use  . Smoking status: Former Games developer  . Smokeless tobacco: Never Used  Vaping Use  . Vaping Use: Never used  Substance Use Topics  . Alcohol use: Yes    Alcohol/week: 0.0 standard drinks  . Drug use: No  Home Medications Prior to Admission medications   Medication Sig Start Date End Date Taking? Authorizing Provider  acetaminophen (TYLENOL) 325 MG tablet Take 650 mg by mouth in the morning and at bedtime.   Yes [provider]  aspirin 81 MG chewable tablet Chew 81 mg by mouth daily as needed (only when flying- to decrease chances of clotting).    Yes [provider]  Cholecalciferol (VITAMIN D-3) 125 MCG (5000 UT) TABS Take 5,000 Units by mouth daily.   Yes [provider]  estradiol (VIVELLE-DOT) 0.1 MG/24HR patch Place 1 patch (0.1 mg total) onto the skin 2 (two) times a week. 02/09/20  Yes Theresia Majors, MD  fluticasone (FLONASE) 50 MCG/ACT nasal spray Place 1-2 sprays into both nostrils daily as needed for allergies or rhinitis.    Yes [provider]  loratadine (CLARITIN) 10 MG tablet Take 10 mg by mouth daily as needed for allergies.    Yes [provider]  Melatonin 10 MG TABS Take 10 mg by mouth at bedtime.   Yes [provider]  Misc Natural Products (TURMERIC CURCUMIN) CAPS Take 1 capsule by mouth 2 (two) times daily.   Yes [provider]  Tavaborole 5 % SOLN Apply 1 drop topically daily. Patient taking differently: Apply 1 drop topically daily as needed (for antifungal issues- to affected toes).  12/09/18  Yes Fontaine, Nadyne Coombes, MD  zolpidem (AMBIEN) 10 MG tablet TAKE ONE TABLET BY MOUTH EVERY NIGHT AT BEDTIME AS NEEDED FOR SLEEP Patient taking differently: Take 10 mg by mouth at bedtime.  12/15/19  Yes Theresia Majors, MD  Acetaminophen (TYLENOL PO) Take by mouth. Patient not taking: Reported on 06/27/2020    [provider]  MELATONIN PO Take by mouth. Patient not taking: Reported on 06/27/2020    [provider]  VITAMIN D PO Take 5,000 Units by mouth.  Patient not taking: Reported on 06/27/2020    [provider]    Allergies    Penicillins, Nsaids, and Penicillin g  Review of Systems   Review of Systems  Constitutional: Negative for chills, fatigue and fever.  HENT: Positive for drooling.   Eyes: Negative for visual disturbance.  Respiratory: Negative for cough, chest tightness and shortness of breath.   Cardiovascular: Negative for chest pain.  Gastrointestinal: Negative for abdominal pain, nausea and vomiting.  Genitourinary: Negative for dysuria.  Musculoskeletal: Negative for back pain and neck pain.  Skin: Negative for color change and rash.  Neurological: Positive for facial asymmetry, speech difficulty and weakness. Negative for light-headedness and headaches.  Psychiatric/Behavioral: Negative for confusion.  All other systems reviewed and are negative.   Physical Exam Updated Vital Signs BP (!) 159/101 (BP Location: Left Arm)   Pulse 78   Temp 98.1 F (36.7 C) (Temporal)   Resp 18   SpO2 97%   Physical Exam Vitals and nursing note reviewed.  Constitutional:      General: She is not in acute distress.    Appearance: She is well-developed.   HENT:     Head: Normocephalic and atraumatic.     Mouth/Throat:     Mouth: Mucous membranes are moist.  Eyes:     Conjunctiva/sclera: Conjunctivae normal.  Cardiovascular:     Rate and Rhythm: Normal rate and regular rhythm.     Pulses: Normal pulses.     Heart sounds: Normal heart sounds. No murmur heard.   Pulmonary:     Effort: Pulmonary effort is normal. No respiratory distress.  Breath sounds: Normal breath sounds.  Abdominal:     Palpations: Abdomen is soft.     Tenderness: There is no abdominal tenderness.  Musculoskeletal:     Cervical back: Normal range of motion and neck supple. No rigidity.     Right lower leg: No edema.     Left lower leg: No edema.  Lymphadenopathy:     Cervical: No cervical adenopathy.  Skin:    General: Skin is warm and dry.  Neurological:     Mental Status: She is alert.     Comments: Mental Status:  Alert, oriented, thought content appropriate, able to give a coherent history. Speech slightly slurred, however without evidence of aphasia. Able to follow 2 step commands without difficulty.  Cranial Nerves:  II:  Peripheral visual fields grossly normal, pupils equal, round, reactive to light III,IV, VI: ptosis not present, extra-ocular motions intact bilaterally  V,VII: Left sided facial droop in smile with flattening of nasolabial fold on left side facial light touch sensation equal VIII: hearing grossly normal to voice  X: uvula elevates symmetrically  XI: bilateral shoulder shrug symmetric and strong XII:  tongue extension without fassiculations Motor:  Normal tone. 5/5 in upper and lower extremities bilaterally including strong and equal grip strength and dorsiflexion/plantar flexion Cerebellar: normal finger-to-nose with bilateral upper extremities, normal heal toe to knee bilaterally CV: distal pulses palpable throughout    Psychiatric:        Mood and Affect: Mood normal.        Behavior: Behavior normal.     ED Results /  Procedures / Treatments   Labs (all labs ordered are listed, but only abnormal results are displayed) Labs Reviewed  CBC - Abnormal; Notable for the following components:      Result Value   WBC 11.6 (*)    HCT 46.1 (*)    All other components within normal limits  DIFFERENTIAL - Abnormal; Notable for the following components:   Neutro Abs 8.5 (*)    All other components within normal limits  COMPREHENSIVE METABOLIC PANEL - Abnormal; Notable for the following components:   Glucose, Bld 104 (*)    All other components within normal limits  I-STAT CHEM 8, ED - Abnormal; Notable for the following components:   Calcium, Ion 1.06 (*)    Hemoglobin 15.6 (*)    All other components within normal limits  CBG MONITORING, ED - Abnormal; Notable for the following components:   Glucose-Capillary 102 (*)    All other components within normal limits  SARS CORONAVIRUS 2 BY RT PCR (HOSPITAL ORDER, PERFORMED IN [facility])  PROTIME-INR  APTT    EKG EKG Interpretation  Date/Time:  Wednesday June 27 2020 10:04:26 EDT Ventricular Rate:  100 PR Interval:  124 QRS Duration: 88 QT Interval:  348 QTC Calculation: 448 R Axis:   71 Text Interpretation: Normal sinus rhythm Biatrial enlargement Abnormal ECG No significant change since last tracing Confirmed by Linwood Dibbles [phone]) on 06/27/2020 4:16:06 PM   Radiology CT Angio Head W or Wo Contrast  Result Date: 06/27/2020 CLINICAL DATA:  Facial tingling and left hand tingling. Slurred speech. EXAM: CT ANGIOGRAPHY HEAD AND NECK TECHNIQUE: Multidetector CT imaging of the head and neck was performed using the standard protocol during bolus administration of intravenous contrast. Multiplanar CT image reconstructions and MIPs were obtained to evaluate the vascular anatomy. Carotid stenosis measurements (when applicable) are obtained utilizing NASCET criteria, using the distal internal carotid diameter as the denominator. CONTRAST:  75mL  OMNIPAQUE IOHEXOL 350 MG/ML SOLN COMPARISON:  None. FINDINGS: CTA NECK FINDINGS SKELETON: There is no bony spinal canal stenosis. No lytic or blastic lesion. OTHER NECK: Normal pharynx, larynx and major salivary glands. No cervical lymphadenopathy. Unremarkable thyroid gland. UPPER CHEST: Cystic structure of the left posterior mediastinum measures 1.9 cm. AORTIC ARCH: There is no calcific atherosclerosis of the aortic arch. There is no aneurysm, dissection or hemodynamically significant stenosis of the visualized portion of the aorta. Conventional 3 vessel aortic branching pattern. The visualized proximal subclavian arteries are widely patent. RIGHT CAROTID SYSTEM: Normal without aneurysm, dissection or stenosis. LEFT CAROTID SYSTEM: Normal without aneurysm, dissection or stenosis. VERTEBRAL ARTERIES: Codominant configuration. Both origins are clearly patent. There is no dissection, occlusion or flow-limiting stenosis to the skull base (V1-V3 segments). CTA HEAD FINDINGS POSTERIOR CIRCULATION: --Vertebral arteries: Normal V4 segments. --Inferior cerebellar arteries: Normal. 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 --Superior cerebellar arteries: Normal. --Posterior cerebral arteries (PCA): Normal. ANTERIOR CIRCULATION: --Intracranial internal carotid arteries: Normal. --Anterior cerebral arteries (ACA): Normal. Both A1 segments are present. Patent anterior communicating artery (a-comm). --Middle cerebral arteries (MCA): Normal. VENOUS SINUSES: As permitted by contrast timing, patent. ANATOMIC VARIANTS: None Review of the MIP images confirms the above findings. IMPRESSION: 1. No emergent large vessel occlusion or high-grade stenosis of the intracranial or cervical arteries. 2. 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 3. 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 Electronically Signed   By: Deatra RobinsonKevin   Herman M.D.   On: 06/27/2020 19:05   CT HEAD WO CONTRAST  Result Date: 06/27/2020 CLINICAL DATA:  Slurred speech with left upper extremity tingling sensations EXAM: CT HEAD WITHOUT CONTRAST TECHNIQUE: Contiguous axial images were obtained from the base of the skull through the vertex without intravenous contrast. COMPARISON:  None. FINDINGS: Brain: Ventricles and sulci are normal in size and configuration. There is an apparent arachnoid cyst in the medial left temporal lobe measuring 1.1 x 0.8 cm. No other evident mass. No hemorrhage, extra-axial fluid collection, or midline shift. There is mild small vessel disease in the centra semiovale bilaterally. There is an area of rather ill-defined decreased attenuation in the white matter of the inferior anterior to mid right centrum semiovale which may represent a recent white matter infarct. No other findings suggesting potential recent/acute infarct. Vascular: No hyperdense vessel. There is slight calcification in each carotid siphon region. Skull: The bony calvarium appears intact. Sinuses/Orbits: There is opacification in several ethmoid air cells on the right posteriorly. There is mucosal thickening in several other ethmoid air cells. Orbits appear symmetric bilaterally. Other: Mastoid air cells are clear. IMPRESSION: 1. Mild periventricular small vessel disease. An area of rather subtle ill-defined decreased attenuation in the anterior inferior right centrum semiovale may represent a recent and possibly acute white matter infarct. No other findings suggesting potential acute infarct by noncontrast enhanced study. No hemorrhage. 2. 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 No other evident mass. 3.  Slight arterial vascular calcification. 4.  Foci of ethmoid sinus disease noted, more severe on the right. Electronically Signed   By: Bretta BangWilliam  Woodruff III M.D.   On: 06/27/2020 10:43   CT Angio Neck W and/or Wo Contrast  Result Date:  06/27/2020 CLINICAL DATA:  Facial tingling and left hand tingling. Slurred speech. EXAM: CT ANGIOGRAPHY HEAD AND NECK TECHNIQUE: Multidetector CT imaging of the head and neck was performed  using the standard protocol during bolus administration of intravenous contrast. Multiplanar CT image reconstructions and MIPs were obtained to evaluate the vascular anatomy. Carotid stenosis measurements (when applicable) are obtained utilizing NASCET criteria, using the distal internal carotid diameter as the denominator. CONTRAST:  71mL OMNIPAQUE IOHEXOL 350 MG/ML SOLN COMPARISON:  None. FINDINGS: CTA NECK FINDINGS SKELETON: There is no bony spinal canal stenosis. No lytic or blastic lesion. OTHER NECK: Normal pharynx, larynx and major salivary glands. No cervical lymphadenopathy. Unremarkable thyroid gland. UPPER CHEST: Cystic structure of the left posterior mediastinum measures 1.9 cm. AORTIC ARCH: There is no calcific atherosclerosis of the aortic arch. There is no aneurysm, dissection or hemodynamically significant stenosis of the visualized portion of the aorta. Conventional 3 vessel aortic branching pattern. The visualized proximal subclavian arteries are widely patent. RIGHT CAROTID SYSTEM: Normal without aneurysm, dissection or stenosis. LEFT CAROTID SYSTEM: Normal without aneurysm, dissection or stenosis. VERTEBRAL ARTERIES: Codominant configuration. Both origins are clearly patent. There is no dissection, occlusion or flow-limiting stenosis to the skull base (V1-V3 segments). CTA HEAD FINDINGS POSTERIOR CIRCULATION: --Vertebral arteries: Normal V4 segments. --Inferior cerebellar arteries: Normal. 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 --Superior cerebellar arteries: Normal. --Posterior cerebral arteries (PCA): Normal. ANTERIOR CIRCULATION: --Intracranial internal carotid arteries: Normal. --Anterior cerebral arteries (ACA): Normal. Both A1 segments are present. Patent anterior  communicating artery (a-comm). --Middle cerebral arteries (MCA): Normal. VENOUS SINUSES: As permitted by contrast timing, patent. ANATOMIC VARIANTS: None Review of the MIP images confirms the above findings. IMPRESSION: 1. No emergent large vessel occlusion or high-grade stenosis of the intracranial or cervical arteries. 2. 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 3. 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 Electronically Signed   By: Deatra Robinson M.D.   On: 06/27/2020 19:05   MR BRAIN WO CONTRAST  Result Date: 06/27/2020 CLINICAL DATA:  58 year old female with neurologic deficit. EXAM: MRI HEAD WITHOUT CONTRAST TECHNIQUE: Multiplanar, multiecho pulse sequences of the brain and surrounding structures were obtained without intravenous contrast. COMPARISON:  CT head without contrast 1038 hours today. CTA head and neck this evening. FINDINGS: Brain: Patchy white matter restricted diffusion in an area of 16 mm at the right corona radiata (series 7, image 58). Associated T2 and FLAIR hyperintensity. No hemorrhage or mass effect. No other restricted diffusion. Mild to moderate scattered additional bilateral white matter T2 and FLAIR hyperintensity. The deep gray nuclei are relatively spared. Perivascular spaces suspected along the caudal lentiform more so the left. No cortical encephalomalacia or chronic cerebral blood products identified. No midline shift, mass effect, evidence of mass lesion, ventriculomegaly, extra-axial collection or acute intracranial hemorrhage. Cervicomedullary junction and pituitary are within normal limits. Negative brainstem and cerebellum. Vascular: Major intracranial vascular flow voids are preserved, with dolichoectatic basilar artery again noted. Skull and upper cervical spine: Negative. Visualized bone marrow signal is within normal limits. Sinuses/Orbits: Negative orbits. Paranasal sinuses and  mastoids are stable and well pneumatized. Other: Visible internal auditory structures appear normal. Visible scalp and face appear negative. IMPRESSION: 1. Patchy acute white matter infarct in the right corona radiata. No associated hemorrhage or mass effect. 2. Underlying mild to moderate additional nonspecific cerebral white matter signal changes, favor due to chronic small vessel disease. Electronically Signed   By: Odessa Fleming M.D.   On: 06/27/2020 19:21    Procedures .Critical Care Performed by: Cristina Gong, PA-C Authorized by: Cristina Gong, PA-C   Critical  care provider statement:    Critical care time (minutes):  45   Critical care was time spent personally by me on the following activities:  Discussions with consultants, evaluation of patient's response to treatment, examination of patient, ordering and performing treatments and interventions, ordering and review of laboratory studies, ordering and review of radiographic studies, pulse oximetry, re-evaluation of patient's condition, obtaining history from patient or surrogate and review of old charts Comments:     TPA considered, not given as patient outside of window.    (including critical care time)  Medications Ordered in ED Medications  acetaminophen (TYLENOL) tablet 650 mg (650 mg Oral Given 06/27/20 1820)  iohexol (OMNIPAQUE) 350 MG/ML injection 75 mL (75 mLs Intravenous Contrast Given 06/27/20 1835)    ED Course  I have reviewed the triage vital signs and the nursing notes.  Pertinent labs & imaging results that were available during my care of the patient were reviewed by me and considered in my medical decision making (see chart for details).  Clinical Course as of Jun 27 2057  Wed Jun 27, 2020  1631 Patient not in room.    [EH]  1701 I spoke with Dr. Amada Jupiter of neurology, he recommends CTA brain and neck, MRI with out contrast.    [EH]  1926 Does show stroke  MR BRAIN WO CONTRAST [EH]  2057 I spoke  with hospitalist who will admit.    [EH]    Clinical Course User Index [EH] Norman Clay   MDM Rules/Calculators/A&P                         Patient is a 59 year old woman who presents today for evaluation of slurred speech and left-sided facial weakness.  This started last night.  Labs are obtained and reviewed, CBC shows normal leukocytosis at 11.6.  CMP is unremarkable.  PT/INR and PTT are both not elevated.  CT head shows concern for a possible ischemic infarct.  I spoke with Dr. Amada Jupiter of neurology.  CTA head and neck were ordered Showing no emergent large vessel occlusion or high-grade stenosis of the intracranial or cervical arteries.  MR brain shows patchy acute white matter infarct in the right corona radiata without associated hemorrhage or mass-effect.  Neurology is aware of patient.  Patient is outside TPA window, does not qualify for LVO.  She was given tylenol after passing stroke swallow screen for a headache.  Patient will be admitted to the hospital.    Note: Portions of this report may have been transcribed using voice recognition software. Every effort was made to ensure accuracy; however, inadvertent computerized transcription errors may be present  Final Clinical Impression(s) / ED Diagnoses Final diagnoses:  Cerebrovascular accident (CVA), unspecified mechanism (HCC)  Slurred speech    Rx / DC Orders ED Discharge Orders    None       Norman Clay 06/27/20 2259    Linwood Dibbles, MD 06/28/20 1538

## 2020-06-27 NOTE — ED Notes (Signed)
Pt transported to CT ?

## 2020-06-27 NOTE — ED Triage Notes (Signed)
At 2015 last night pt developed singling in fingers on L hand and lips, slurred speech, and drooling. Pt's symptoms improved, although did not resolve totally. She got up every hour or two to check her symptoms as she is a retired Engineer, civil (consulting), with no worsening. In triage facial droop still present, pt feels as if her speech is still not back to normal, and endorses tingling in two fingers on L hand. Was planning to see neurology outpatient today if she could get an appointment but while typing on the computer noticed her coordination to be less in L hand and went to [facility], who sent her here.

## 2020-06-28 ENCOUNTER — Observation Stay (HOSPITAL_COMMUNITY): Payer: 59

## 2020-06-28 ENCOUNTER — Other Ambulatory Visit (HOSPITAL_COMMUNITY): Payer: 59

## 2020-06-28 ENCOUNTER — Observation Stay (HOSPITAL_BASED_OUTPATIENT_CLINIC_OR_DEPARTMENT_OTHER): Payer: 59

## 2020-06-28 DIAGNOSIS — I6389 Other cerebral infarction: Secondary | ICD-10-CM | POA: Diagnosis not present

## 2020-06-28 DIAGNOSIS — I639 Cerebral infarction, unspecified: Secondary | ICD-10-CM | POA: Diagnosis not present

## 2020-06-28 LAB — ECHOCARDIOGRAM COMPLETE
Area-P 1/2: 2.8 cm2
S' Lateral: 2.34 cm

## 2020-06-28 LAB — LIPID PANEL
Cholesterol: 174 mg/dL (ref 0–200)
HDL: 41 mg/dL (ref >40)
LDL Cholesterol: 98 mg/dL (ref 0–99)
Total CHOL/HDL Ratio: 4.2 RATIO
Triglycerides: 175 mg/dL — ABNORMAL HIGH (ref <150)
VLDL: 35 mg/dL (ref 0–40)

## 2020-06-28 LAB — HEMOGLOBIN A1C
Hgb A1c MFr Bld: 5.7 % — ABNORMAL HIGH (ref 4.8–5.6)
Mean Plasma Glucose: 116.89 mg/dL

## 2020-06-28 LAB — HIV ANTIBODY (ROUTINE TESTING W REFLEX): HIV Screen 4th Generation wRfx: NONREACTIVE

## 2020-06-28 MED ORDER — ASPIRIN 81 MG PO TBEC
81.0000 mg | DELAYED_RELEASE_TABLET | Freq: Every day | ORAL | 11 refills | Status: DC
Start: 2020-06-29 — End: 2020-08-01

## 2020-06-28 MED ORDER — CLOPIDOGREL BISULFATE 75 MG PO TABS
75.0000 mg | ORAL_TABLET | Freq: Every day | ORAL | 0 refills | Status: DC
Start: 2020-06-29 — End: 2020-07-30

## 2020-06-28 MED ORDER — ATORVASTATIN CALCIUM 40 MG PO TABS: 40.0000 mg | ORAL_TABLET | Freq: Every day | ORAL | Status: DC

## 2020-06-28 MED ORDER — GADOBUTROL 1 MMOL/ML IV SOLN
8.0000 mL | Freq: Once | INTRAVENOUS | Status: AC | PRN
  Administered 2020-06-28: 8 mL via INTRAVENOUS

## 2020-06-28 MED ORDER — ATORVASTATIN CALCIUM 40 MG PO TABS
40.0000 mg | ORAL_TABLET | Freq: Every day | ORAL | 0 refills | Status: DC
Start: 2020-06-29 — End: 2020-07-30

## 2020-06-28 MED ORDER — LORAZEPAM 0.5 MG PO TABS: 0.5000 mg | ORAL_TABLET | Freq: Once | ORAL | Status: DC | PRN

## 2020-06-28 MED ORDER — ATORVASTATIN CALCIUM 40 MG PO TABS
80.0000 mg | ORAL_TABLET | Freq: Every day | ORAL | Status: DC
  Administered 2020-06-28: 80 mg via ORAL
  Filled 2020-06-28: qty 2

## 2020-06-28 NOTE — Discharge Instructions (Signed)
Dear Johny Shears,   Thank you so much for allowing [place] to be part of your care!  You were admitted to [facility] for a stroke.   POST-HOSPITAL & CARE INSTRUCTIONS 1. Please follow-up in the Internal Medicine Resident Clinic noted below, 07/11/2020. 2. Please follow-up with your OBGYN about these events. 3. Please make and appointment with Neurology in the future to address your migraines.  4. Please let PCP/Specialists know of any changes that were made.  5. Please see medications section of this packet for any medication changes.   DOCTOR'S APPOINTMENT & FOLLOW UP CARE INSTRUCTIONS  Future Appointments  Date Time Provider Department Center  07/11/2020  1:45 PM Versie Starks, DO [facility]  12/17/2020  9:00 AM Theresia Majors, MD [facility]    RETURN PRECAUTIONS:   Take care and be well!  Internal Medicine Teaching Service Inpatient Team [place]  [facility]  [address] [place], [place] [address] [phone]

## 2020-06-28 NOTE — ED Notes (Signed)
Patient transported to MRI 

## 2020-06-28 NOTE — Hospital Course (Addendum)
   Christina Lloyd is a 59 y/o F with a PMHx of PVC's, urinary incontinence, and patient notes undiagnosed hypertension, presents to the ED from urgent care for numbness/tingling of 1st/2nd digit, dysarthria, left sided facial droop and drooling for the past day. She initially presented to urgent care and they sent the patient to the ED for acute stroke workup.    Acute CVA She had CT Head, CTA Head and Neck, MRI Brain, MRA Head and Neck. It was concluded from imaging that the patient suffered from a white matter infarct in the right corona radiata with no hemorrhage or mass effect. Official diagnosis was Lacunar infarct. Patient was admitted for stroke workup. MRA Head and neck was negative for emergent findings or stenosis. Patient hx was significant for HTN untreated, former smoker,  exogenous estrogen, and ocular migraines. Patient underwent echo which was negative for cardiac source of embolism. Patient neurological exam improved over hospitalization and although patient continued to have facial droop and some dysarthria. Patient was also started on atorvastatin 80, ASA and Plavix. Patient instructed to take her Plavix for 3 weeks and then ASA alone after.    History of High Blood Pressure Patient was started on amlodipine 5mg . Patient sys remained less that 180 and stable.    Right Groin Pain Patient reported some right groin pain since 04/2020. On physical exam patient has pain with flexion at the hip and has a history of doing difficulty stretches. Patient had normal gait and physical exam was not concerning for clot. Patient remained able to manipulate her body and had 5/5 strength in her hips bilaterally.

## 2020-06-28 NOTE — H&P (Signed)
Date: 06/28/2020               Patient Name:  Christina Lloyd MRN: [id number]  DOB: 1961-04-29 Age / Sex: 59 y.o., female   PCP: Geoffry Paradise, MD         Medical Service: Internal Medicine Teaching Service         Attending Physician: Dr. Mayford Knife, Dorene Ar, MD    First Contact: Dr. Morrie Sheldon Pager: [phone]  Second Contact: Dr. Marchia Bond Pager: [phone]       After Hours (After 5p/  First Contact Pager: [phone]  weekends / holidays): Second Contact Pager: [phone]   Chief Complaint: Numbness/Tingling, Dysarthria  History of Present Illness:   Christina Lloyd is a 59 y/o F with a PMHx of PVC's, urinary incontinence who presents to the ED from Urgent Care for new onset left 1st,2nd digit numbness/tingling, left sided facial droop with drooling, and tongue numbness with dysarthria for the past two days.   She states on 06/26/20 she noticed she was having tingling in the 1st and second digit of her left hand and on her lips as well. Approximately an hour later she began having slurred speech and left sided facial droop. Yesterday morning, she woke with improvement of her symptoms, but they had not completely resolved. She continued to have some left digit numbness/tingling as well as slurred speech and left sided facial droop. She also noted decrease coordination in her left hand this morning and at that point, she decided to present to urgent care.   Upon my examination patient continues to endorse dysarthria, left sided facial droop, left 1st/2nd digit numbness/tingling  Upon her arrival, urgent care provider sent the patient to the ED for further evaluation. She denies new headaches, changes in vision/blurry vision, difficulty swallowing, difficulty hearing. She denies any upper or lower extremity weakness. She denies any gait abnormalities. She denies loss of bowel or bladder function. She denies any fever, chills, recent illness, shortness of breath, cough, chest pain,  palpitations/fluttering, abdominal pain, nausea, vomiting, diarrhea.   She denies prior CVA/TIA. She states she has a history of high blood pressure but has never been formally diagnosed with hypertension. She notes recently traveling on a plane to [place] within the last week.   Meds:  Current Meds  Medication Sig  . acetaminophen (TYLENOL) 325 MG tablet Take 650 mg by mouth in the morning and at bedtime.  Marland Kitchen aspirin 81 MG chewable tablet Chew 81 mg by mouth daily as needed (only when flying- to decrease chances of clotting).   . Cholecalciferol (VITAMIN D-3) 125 MCG (5000 UT) TABS Take 5,000 Units by mouth daily.  Marland Kitchen estradiol (VIVELLE-DOT) 0.1 MG/24HR patch Place 1 patch (0.1 mg total) onto the skin 2 (two) times a week.  . fluticasone (FLONASE) 50 MCG/ACT nasal spray Place 1-2 sprays into both nostrils daily as needed for allergies or rhinitis.   Marland Kitchen loratadine (CLARITIN) 10 MG tablet Take 10 mg by mouth daily as needed for allergies.   . Melatonin 10 MG TABS Take 10 mg by mouth at bedtime.  . Misc Natural Products (TURMERIC CURCUMIN) CAPS Take 1 capsule by mouth 2 (two) times daily.  . Tavaborole 5 % SOLN Apply 1 drop topically daily. (Patient taking differently: Apply 1 drop topically daily as needed (for antifungal issues- to affected toes). )  . zolpidem (AMBIEN) 10 MG tablet TAKE ONE TABLET BY MOUTH EVERY NIGHT AT BEDTIME AS NEEDED FOR SLEEP (Patient taking differently: Take 10  mg by mouth at bedtime. )     Allergies: Allergies as of 06/27/2020 - Review Complete 06/27/2020  Allergen Reaction Noted  . Penicillins Other (See Comments) 06/11/2012  . Nsaids Other (See Comments) 12/12/2013  . Penicillin g Other (See Comments) 11/19/2016   Past Medical History:  Diagnosis Date  . Adenocarcinoma in situ (AIS) of uterine cervix 2012  . Blepharitis   . History of shingles   . Ocular migraine   . Osteopenia 08/2018   T score -1.1 FRAX 22% / 0.7%  . Proctitis   . PVC (premature  ventricular contraction)   . Urinary incontinence   . 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   Physical Exam: Blood pressure (!) 151/77, pulse 84, temperature 98.1 F (36.7 C), temperature source Temporal, resp. rate 17, SpO2 92 %. Physical Exam Vitals and nursing note reviewed.  Constitutional:      General: She is not in acute distress.    Appearance: Normal appearance. She is not ill-appearing, toxic-appearing or diaphoretic.  HENT:     Head: Normocephalic and atraumatic.     Mouth/Throat:     Mouth: Mucous membranes are dry.     Pharynx: No oropharyngeal exudate or posterior oropharyngeal erythema.  Eyes:     General: No visual field deficit.    Extraocular Movements: Extraocular movements intact.     Pupils: Pupils are equal, round, and reactive to light.  Cardiovascular:     Rate and Rhythm: Normal rate and regular rhythm.     Pulses: Normal pulses.     Heart sounds: Normal heart sounds. No murmur heard.  No friction rub. No gallop.   Pulmonary:     Effort: Pulmonary effort is normal. No respiratory distress.     Breath sounds: Normal breath sounds. No stridor. No wheezing, rhonchi or rales.  Chest:     Chest wall: No tenderness.  Abdominal:     General: Abdomen is flat. Bowel sounds are normal. There is no distension.     Palpations: Abdomen is soft.     Tenderness: There is no abdominal tenderness. There is no guarding or rebound.  Musculoskeletal:        General: No swelling or tenderness.     Cervical back: Normal range of motion and neck supple. No rigidity or tenderness.     Right lower leg: No edema.     Left  lower leg: No edema.  Lymphadenopathy:     Cervical: No cervical adenopathy.  Skin:    General: Skin is warm and dry.  Neurological:     Mental Status: She is alert and oriented to person, place, and time. Mental status is at baseline.     Cranial Nerves: Dysarthria and facial asymmetry present.     Sensory: Sensation is intact.     Motor: Motor function is intact. No weakness, atrophy, abnormal muscle tone or pronator drift.     Coordination: Coordination is intact. Coordination normal. Finger-Nose-Finger Test and Heel to [facility] Test normal.  Psychiatric:        Mood and Affect: Mood normal.        Behavior: Behavior normal.  EKG: Rate of 100. Normal sinus rhythm. p waves present. No PR or QT lengthening. Normal Axis. No ST wave abnormalities. No prior EKG for comparison available.    CT Head w/o Contrast IMPRESSION: 1. Mild periventricular small vessel disease. An area of rather subtle ill-defined decreased attenuation in the anterior inferior right centrum semiovale may represent a recent and possibly acute white matter infarct. No other findings suggesting potential acute infarct by noncontrast enhanced study. No hemorrhage. 2. 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 No other evident mass. 3.  Slight arterial vascular calcification. 4.  Foci of ethmoid sinus disease noted, more severe on the right.  CT Angio Neck w/wo Contrast IMPRESSION: 1. No emergent large vessel occlusion or high-grade stenosis of the intracranial or cervical arteries. 2. 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 3. 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  MR Brain w/o Contrast IMPRESSION: 1. Patchy acute white matter infarct in the right corona radiata. No associated hemorrhage or mass effect. 2. Underlying mild to moderate additional nonspecific cerebral white matter signal  changes, favor due to chronic small vessel disease.  Assessment & Plan by Problem: Active Problems:   Acute CVA (cerebrovascular accident) (HCC)  Christina Lloyd is a 59 y/o F with a PMHx of PVC's, urinary incontinence, and patient notes undiagnosed hypertension, presents to the ED from urgent care for numbness/tingling of 1st/2nd digit, dysarthria, left sided facial droop and drooling for the past day. She initially presented to urgent care and they sent the patient to the ED for acute stroke workup. Upon my evaluation patient had left sided facial droop with dysarthria. MRI brain revealed white matter infarct right corona radiata. No hemorrhage. Patient will be admitted for acute CVA workup and treatment.   Acute CVA Patient's history, neurological examination, and imaging consistent with acute CVA. Imaging revealed infarct right corona radiata. Symptoms onset evening of 06/26/20. Patient outside of TPA window. Stroke team notified. No history of prior CVA/TIA. No history of CAD per documentation. Risk factors of age, obesity, prior smoker, alcohol use. She endorses recent plane rides to [place] that were 1-2 hours in duration each. She also notes being prescribed estradiol. Patient notes high blood pressure undiagnosed and states " I would not be surprised if I have diabetes." Neurology following at this time, appreciate there recommendations and assistance in patient care.  - Will follow neurology recommendations, consider MRA head/Neck to further assess intracranial vasculature as well as carotid arteries. Will D/C carotid US if MRA head/neck performed.  - Echocardiogram ordered - Frequent Neuro Checks - Aspirin 81 mg daily, given aspiring 325 while in ED - Plavix 75 mg oral daily - Lipid panel pending. Consider adding statin once results.  - A1C pending - Patient no longer NPO, passed swallow study.  - Discontinuing estradiol during admission - PT/OT not ordered at this time as patient  denies gait abnormalities and had no decrease in strength upon physical exam.   History of High Blood Pressure Patient notes elevated blood pressure in the past, never diagnosed. BP range from 167-151 systolic and 105/77 diastolic.   - Amlodipine 5 mg oral daily, will recommend patient follow up with PCP for further BP managment  Diet: Heart Healthy DVT Prophylaxis: SCD's Code Status: DNR Dispo: Admit patient to Inpatient with expected length of stay greater than 2 midnights.  Signed: Belva Agee, MD 06/28/2020, 2:37 AM  After 5pm  on weekdays and 1pm on weekends: On Call pager: [phone]

## 2020-06-28 NOTE — Progress Notes (Signed)
STROKE TEAM PROGRESS NOTE   INTERVAL HISTORY Patient remains in the ED, testing underway. Patient is a retired [employer].  I have personally reviewed history of presenting illness, electronic medical records and imaging films in PACS.  She presented with sudden onset left fingertip and face paresthesias which are improving but still present.  MRI scan shows right corona radiata lacunar infarct.  CT angiogram showed no significant large vessel extracranial intracranial stenosis.  LDL cholesterol is elevated at 98 mg percent.  Hemoglobin A1c is 5.7.  Echocardiogram is pending. Vitals:   06/28/20 0800 06/28/20 0950 06/28/20 0952 06/28/20 0953  BP: (!) 158/137 (!) 148/98    Pulse: (!) 101 (!) 111 (!) 111 (!) 113  Resp: (!) 23 (!) 22 (!) 26 (!) 23  Temp:      TempSrc:      SpO2: 96% 99% 98% 99%   CBC:  Recent Labs  Lab 06/27/20 1008 06/27/20 1051  WBC 11.6*  --   NEUTROABS 8.5*  --   HGB 14.7 15.6*  HCT 46.1* 46.0  MCV 91.7  --   PLT 327  --    Basic Metabolic Panel:  Recent Labs  Lab 06/27/20 1008 06/27/20 1051  NA 141 141  K 3.6 3.7  CL 106 106  CO2 24  --   GLUCOSE 104* 99  BUN 7 7  CREATININE 0.62 0.50  CALCIUM 8.9  --    Lipid Panel:  Recent Labs  Lab 06/28/20 0545  CHOL 174  TRIG 175*  HDL 41  CHOLHDL 4.2  VLDL 35  LDLCALC 98   HgbA1c:  Recent Labs  Lab 06/28/20 0545  HGBA1C 5.7*   Urine Drug Screen: No results for input(s): LABOPIA, COCAINSCRNUR, LABBENZ, AMPHETMU, THCU, LABBARB in the last 168 hours.  Alcohol Level No results for input(s): ETH in the last 168 hours.  IMAGING past 24 hours CT Angio Head W or Wo Contrast  Result Date: 06/27/2020 CLINICAL DATA:  Facial tingling and left hand tingling. Slurred speech. EXAM: CT ANGIOGRAPHY HEAD AND NECK TECHNIQUE: Multidetector CT imaging of the head and neck was performed using the standard protocol during bolus administration of intravenous contrast. Multiplanar CT image reconstructions and MIPs were obtained to  evaluate the vascular anatomy. Carotid stenosis measurements (when applicable) are obtained utilizing NASCET criteria, using the distal internal carotid diameter as the denominator. CONTRAST:  14mL OMNIPAQUE IOHEXOL 350 MG/ML SOLN COMPARISON:  None. FINDINGS: CTA NECK FINDINGS SKELETON: There is no bony spinal canal stenosis. No lytic or blastic lesion. OTHER NECK: Normal pharynx, larynx and major salivary glands. No cervical lymphadenopathy. Unremarkable thyroid gland. UPPER CHEST: Cystic structure of the left posterior mediastinum measures 1.9 cm. AORTIC ARCH: There is no calcific atherosclerosis of the aortic arch. There is no aneurysm, dissection or hemodynamically significant stenosis of the visualized portion of the aorta. Conventional 3 vessel aortic branching pattern. The visualized proximal subclavian arteries are widely patent. RIGHT CAROTID SYSTEM: Normal without aneurysm, dissection or stenosis. LEFT CAROTID SYSTEM: Normal without aneurysm, dissection or stenosis. VERTEBRAL ARTERIES: Codominant configuration. Both origins are clearly patent. There is no dissection, occlusion or flow-limiting stenosis to the skull base (V1-V3 segments). CTA HEAD FINDINGS POSTERIOR CIRCULATION: --Vertebral arteries: Normal V4 segments. --Inferior cerebellar arteries: Normal. 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 --Superior cerebellar arteries: Normal. --Posterior cerebral arteries (PCA): Normal. ANTERIOR CIRCULATION: --Intracranial internal carotid arteries: Normal. --Anterior cerebral arteries (ACA): Normal. Both A1 segments are present. Patent anterior communicating artery (a-comm). --Middle cerebral arteries (  MCA): Normal. VENOUS SINUSES: As permitted by contrast timing, patent. ANATOMIC VARIANTS: None Review of the MIP images confirms the above findings. IMPRESSION: 1. No emergent large vessel occlusion or high-grade stenosis of the intracranial or cervical arteries. 2. 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 3. 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 Electronically Signed   By: Deatra RobinsonKevin  Herman M.D.   On: 06/27/2020 19:05   CT Angio Neck W and/or Wo Contrast  Result Date: 06/27/2020 CLINICAL DATA:  Facial tingling and left hand tingling. Slurred speech. EXAM: CT ANGIOGRAPHY HEAD AND NECK TECHNIQUE: Multidetector CT imaging of the head and neck was performed using the standard protocol during bolus administration of intravenous contrast. Multiplanar CT image reconstructions and MIPs were obtained to evaluate the vascular anatomy. Carotid stenosis measurements (when applicable) are obtained utilizing NASCET criteria, using the distal internal carotid diameter as the denominator. CONTRAST:  75mL OMNIPAQUE IOHEXOL 350 MG/ML SOLN COMPARISON:  None. FINDINGS: CTA NECK FINDINGS SKELETON: There is no bony spinal canal stenosis. No lytic or blastic lesion. OTHER NECK: Normal pharynx, larynx and major salivary glands. No cervical lymphadenopathy. Unremarkable thyroid gland. UPPER CHEST: Cystic structure of the left posterior mediastinum measures 1.9 cm. AORTIC ARCH: There is no calcific atherosclerosis of the aortic arch. There is no aneurysm, dissection or hemodynamically significant stenosis of the visualized portion of the aorta. Conventional 3 vessel aortic branching pattern. The visualized proximal subclavian arteries are widely patent. RIGHT CAROTID SYSTEM: Normal without aneurysm, dissection or stenosis. LEFT CAROTID SYSTEM: Normal without aneurysm, dissection or stenosis. VERTEBRAL ARTERIES: Codominant configuration. Both origins are clearly patent. There is no dissection, occlusion or flow-limiting stenosis to the skull base (V1-V3 segments). CTA HEAD FINDINGS POSTERIOR CIRCULATION: --Vertebral arteries: Normal V4 segments. --Inferior cerebellar arteries: Normal. --Basilar artery: Dolichoectasia of the  basilar artery measuring 6 mm. --Superior cerebellar arteries: Normal. --Posterior cerebral arteries (PCA): Normal. ANTERIOR CIRCULATION: --Intracranial internal carotid arteries: Normal. --Anterior cerebral arteries (ACA): Normal. Both A1 segments are present. Patent anterior communicating artery (a-comm). --Middle cerebral arteries (MCA): Normal. VENOUS SINUSES: As permitted by contrast timing, patent. ANATOMIC VARIANTS: None Review of the MIP images confirms the above findings. IMPRESSION: 1. No emergent large vessel occlusion or high-grade stenosis of the intracranial or cervical arteries. 2. 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 3. 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 Electronically Signed   By: Deatra RobinsonKevin  Herman M.D.   On: 06/27/2020 19:05   MR ANGIO HEAD WO CONTRAST  Result Date: 06/28/2020 CLINICAL DATA:  Acute infarcts. EXAM: MRA HEAD WITHOUT CONTRAST TECHNIQUE: Angiographic images of the Circle of Willis were obtained using MRA technique without intravenous contrast. COMPARISON:  None. FINDINGS: The carotid and vertebral arteries are widely patent. The basilar is actually patulous at 5-6 mm diameter, which may be a sign of chronic hypertension. No branch occlusion, beading, or aneurysm. IMPRESSION: No emergent finding or stenosis. Electronically Signed   By: Marnee SpringJonathon  Watts M.D.   On: 06/28/2020 09:52   MR ANGIO NECK W WO CONTRAST  Result Date: 06/28/2020 CLINICAL DATA:  Acute stroke EXAM: MRA NECK WITHOUT AND WITH CONTRAST TECHNIQUE: Multiplanar and multiecho pulse sequences of the neck were obtained without and with intravenous contrast. Angiographic images of the neck were obtained using MRA technique without and with intravenous contrast. CONTRAST:  8mL GADAVIST GADOBUTROL 1 MMOL/ML IV SOLN COMPARISON:  None. FINDINGS: Normal arch  with 3 vessel branching. Antegrade flow in both carotid and vertebral  arteries. Neck mask is unremarkable. The carotid and vertebral arteries are smooth and widely patent on both sides. IMPRESSION: Negative neck MRA. Electronically Signed   By: Marnee Spring M.D.   On: 06/28/2020 09:57   MR BRAIN WO CONTRAST  Result Date: 06/27/2020 CLINICAL DATA:  59 year old female with neurologic deficit. EXAM: MRI HEAD WITHOUT CONTRAST TECHNIQUE: Multiplanar, multiecho pulse sequences of the brain and surrounding structures were obtained without intravenous contrast. COMPARISON:  CT head without contrast 1038 hours today. CTA head and neck this evening. FINDINGS: Brain: Patchy white matter restricted diffusion in an area of 16 mm at the right corona radiata (series 7, image 58). Associated T2 and FLAIR hyperintensity. No hemorrhage or mass effect. No other restricted diffusion. Mild to moderate scattered additional bilateral white matter T2 and FLAIR hyperintensity. The deep gray nuclei are relatively spared. Perivascular spaces suspected along the caudal lentiform more so the left. No cortical encephalomalacia or chronic cerebral blood products identified. No midline shift, mass effect, evidence of mass lesion, ventriculomegaly, extra-axial collection or acute intracranial hemorrhage. Cervicomedullary junction and pituitary are within normal limits. Negative brainstem and cerebellum. Vascular: Major intracranial vascular flow voids are preserved, with dolichoectatic basilar artery again noted. Skull and upper cervical spine: Negative. Visualized bone marrow signal is within normal limits. Sinuses/Orbits: Negative orbits. Paranasal sinuses and mastoids are stable and well pneumatized. Other: Visible internal auditory structures appear normal. Visible scalp and face appear negative. IMPRESSION: 1. Patchy acute white matter infarct in the right corona radiata. No associated hemorrhage or mass effect. 2. Underlying mild to moderate additional nonspecific cerebral white matter signal changes,  favor due to chronic small vessel disease. Electronically Signed   By: Odessa Fleming M.D.   On: 06/27/2020 19:21    PHYSICAL EXAM Pleasant middle-aged Caucasian lady not in distress.  She appears anxious. . Afebrile. Head is nontraumatic. Neck is supple without bruit.    Cardiac exam no murmur or gallop. Lungs are clear to auscultation. Distal pulses are well felt. Neurological Exam ;  Awake  Alert oriented x 3. Normal speech and language.eye movements full without nystagmus.fundi were not visualized. Vision acuity and fields appear normal. Hearing is normal. Palatal movements are normal. Face asymmetric with mild left lower facial weakness.. Tongue midline. Normal strength, tone, reflexes and coordination.  Except for diminished fine finger movements on the left and orbits right over left upper extremity.  Normal sensation. Gait deferred. ASSESSMENT/PLAN Ms. Christina Lloyd is a 59 y.o. female with history of ocular migraines, PVC, uterine/cervical cancer presenting with slurred speech and L hand and facial paresthesias.   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 Periventricular Small vessel disease. Medial L temporal lobe arachnoid cyst. Slight arterial vascular calcification. R>L ethmoid sinus dz.   CTA head & neck no LVO or significant stenosis. VA dolichoectasia. L posterior mediastinum cyst  MRI  Patchy R corona radiata infarct. Small vessel disease  2D Echo normal ejection fraction.  No cardiac source of embolism.  LDL 98  HgbA1c 5.7  VTE prophylaxis - SCDs   Recent flight this weekend when she took aspirin but typically does not take aspirin routinely.  now on aspirin 81 mg daily and clopidogrel 75 mg daily. Continue DAPT x 3 weeks then aspirin alone  Therapy recommendations:  pending   Disposition:  pending   Hypertension  Stable today  BP elevated yesterday at  183/107 . Permissive hypertension (OK if < 220/120) but gradually normalize in 5-7 days . Long-term BP goal normotensive  Hyperlipidemia  Home meds:  No statin  Now on lipitor 80. Will decrease to 40 given current level and goal  LDL 98, goal < 70  Continue statin at discharge  Other Stroke Risk Factors  Former Cigarette smoker  ETOH use, advised to drink no more than 1 drink(s) a day  Family hx stroke ("quite a few per pt" including paternal grandmother, paternal grandfather)  R visual deficits about 1 time a month, most likely an ocular Migraine equivalent. Patient advised to seek care if any change in typical episodes   Snores. Has not been checked for obstructive sleep apnea, will consider SleepSmart Study  Other Active Problems  Hx uterine/cervical cancer 2012, 2015  Hospital day # 1 She presented with sudden onset of left-sided paresthesias and facial weakness secondary to right brain subcortical infarct from small vessel disease.  Recommend aspirin Plavix for 3 weeks followed by aspirin alone and add statin for elevated lipids.  Aggressive risk factor modification.  Patient may benefit with consideration for possible participation in the Sleep Smart Stroke prevention study if interested.  Greater than 50% time during this 35-minute visit was spent in counseling and coordination of care and discussion about stroke prevention and treatment and answering questions. Delia Heady, MD  To contact Stroke Continuity provider, please refer to [email or web address]. After hours, contact General Neurology

## 2020-06-28 NOTE — Progress Notes (Signed)
Subjective: Patient had no overnight events post admission. Patient reports doing well this AM. She has no complaints of pain, but does reports that she  Has had ocular migranes over the past 2 years that she has only talked to her opthamaologist about. Her OBGYN was unaware of these and she reports that she had been taking exogenous estrogen after a hysterectomy that left her ovaries. Patient also reports that she got the [employer] vaccine 04/17/2020 and that she had a somewhat painful sensation in her R inguinal/groin area since then. She also reports that she has been having some new onset peripheral neuropathy and does not have a PCP at this time. She is hoping to get in with [facility], but wont be able to start until 10/2020 when she has insurance. Patient is also concerned about her hypertension since being in the ED.   Objective:  Vital signs in last 24 hours: Vitals:   06/28/20 0800 06/28/20 0950 06/28/20 0952 06/28/20 0953  BP: (!) 158/137 (!) 148/98    Pulse: (!) 101 (!) 111 (!) 111 (!) 113  Resp: (!) 23 (!) 22 (!) 26 (!) 23  Temp:      TempSrc:      SpO2: 96% 99% 98% 99%   Physical Exam Constitutional:      General: She is not in acute distress.    Appearance: She is obese. She is not ill-appearing, toxic-appearing or diaphoretic.  HENT:     Head: Normocephalic and atraumatic.     Mouth/Throat:     Mouth: Mucous membranes are moist.     Pharynx: No oropharyngeal exudate.  Eyes:     Extraocular Movements: Extraocular movements intact.     Conjunctiva/sclera: Conjunctivae normal.     Pupils: Pupils are equal, round, and reactive to light.  Cardiovascular:     Rate and Rhythm: Regular rhythm. Tachycardia present.     Pulses: Normal pulses.  Pulmonary:     Effort: Pulmonary effort is normal.     Breath sounds: Normal breath sounds.  Abdominal:     General: Abdomen is flat.     Palpations: Abdomen is soft.  Musculoskeletal:     Comments: R grip  5/5, L grip 4/5 L arm 4/5, R am 5/5, BLE 5/5, Bil dorsi- and plantar flexion    Neurological:     Mental Status: She is alert and oriented to person, place, and time.     Cranial Nerves: Cranial nerve deficit present.     Sensory: No sensory deficit.     Motor: Weakness present.     Coordination: Coordination normal.     Gait: Gait normal.     Comments: L eft CN 7 defecit. CN3-12 otherwise intact Negative romberg sign Normal finger-to- nose bilateral     Assessment/Plan:  Active Problems:   Acute CVA (cerebrovascular accident) (HCC) Tericka Devincenzi is a 59 y/o F with a PMHx of PVC's, urinary incontinence, and patient notes undiagnosed hypertension, presents to the ED from urgent care for numbness/tingling of 1st/2nd digit, dysarthria, left sided facial droop and drooling for the past day. She initially presented to urgent care and they sent the patient to the ED for acute stroke workup. Upon my evaluation patient had left sided facial droop with dysarthria. MRI brain revealed white matter infarct right corona radiata. No hemorrhage. Patient will be admitted for acute CVA workup and treatment.   Acute CVA Patient has a right corona radiata infarction, likely lacunar/ small vessel etiology. Patient  was at increased risk for this due to her hx of HTN, hx of tobacco smoking, ocular migraines, and use of exogenous estrogen. Patient may have also been placed at increased for stroke do to her history of [employer] Covid 19 vaccine. Patient has been instructed to discontinue her estrogen at this time and notify her OBGYN of the events. Repeat MRA Head and Neck this AM did not show change from previous MRI Brain. MRA brain did find evidence that suggested chronic HTN. There was no branch occlusion or bleeding anuerysm. MRA neck was negative. No need for Carotid US. Lipid panel noted elevated TGs 175, Cholesterol WNL, HDL WNL, VLDL WNL, and LDL WNL. Hgb A1c resulted 5.7. - Echocardiogram  ordered - Frequent Neuro Checks - Aspirin 81 mg daily - Plavix 75 mg oral daily - Atorvastatin 40 daily  - Discontinuing estradiol at discharge and at admission  History of High Blood Pressure Patient notes elevated blood pressure in the past, never diagnosed. BP range from 167-151 systolic and 105/77 diastolic.  - Consider increasing amlodipine patient is now 48 hrs out from initial symptoms  Right Groin Pain Patient reports some right groin pain since 04/2020. On physical exam patient has pain with flexion at the hip and has a history of doing difficulty stretches. At this time likely that patient has pulled a muscle or stretched a tendon.  - Recommend warm compress  Prior to Admission Living Arrangement:Home Anticipated Discharge Location:Home Barriers to Discharge: Treatment? Dispo: Anticipated discharge in approximately 1-2 day(s).   Bobbye Morton, MD 06/28/2020, 11:30 AM Pager: [phone] After 5pm on weekdays and 1pm on weekends: On Call pager [phone]

## 2020-06-28 NOTE — ED Notes (Signed)
Pt stated that she still has very minimal tingling left in the tips of her left thumb & index finger. Minimal left sided droop noted in smile & pt endorses drooling "a little still."

## 2020-06-28 NOTE — Progress Notes (Signed)
  Echocardiogram 2D Echocardiogram has been performed.  Celene Skeen 06/28/2020, 10:51 AM

## 2020-06-28 NOTE — Consult Note (Signed)
Requesting Physician: Celedonio Miyamoto    Chief Complaint: Left facial numbness  History obtained from: Patient and Chart    HPI:                                                                                                                                       Christina Lloyd is a 59 y.o. female with past medical history significant for ocular migraines, PVC, urine cancer presents to the emergency department with slurred speech and left hand and facial paresthesias that began yesterday.  This morning she woke up with symptoms and went to the urgent care and was directed to the emergency department.  Her blood pressure slightly elevated 150s systolic on arrival.  CT head was obtained which showed no acute findings followed by MRI of the brain demonstrated right white matter subcortical infarction.  Neurology consulted for further recommendations.  CT angiogram was performed which showed no large vessel occlusion.  Patient not a candidate for TPA as she is outside the window.  Date last known well: 9.14.21 Time last known well: 9:15 PM tPA Given: No, outside TPA NIHSS: 2 Baseline MRS 0    Past Medical History:  Diagnosis Date  . Adenocarcinoma in situ (AIS) of uterine cervix 2012  . Blepharitis   . History of shingles   . Ocular migraine   . Osteopenia 08/2018   T score -1.1 FRAX 22% / 0.7%  . Proctitis   . PVC (premature ventricular contraction)   . Urinary incontinence   . 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 GALLBLADDER SURGERY    . LAVH  2012   AIS, HGSIL  . PILONIDAL CYST EXCISION    . VAGINAL HYSTERECTOMY      Family History  Problem Relation Age of Onset  . Heart disease Mother   . Alzheimer's disease Mother   . Heart disease Father   . Diabetes Father   . Heart disease Maternal Grandmother   . Diabetes Maternal Grandmother   . Heart  disease Maternal Grandfather   . Diabetes Maternal Grandfather   . Stroke Paternal Grandmother   . Breast cancer Paternal Grandmother 33  . Stroke Paternal Grandfather   . Thyroid disease Brother    Social History:  reports that she has quit smoking. She has never used smokeless tobacco. She reports current alcohol use. She reports that she does not use drugs.  Allergies:  Allergies  Allergen Reactions  . Penicillins Other (See Comments)    Childhood reaction- reaction ??  . Nsaids Other (See Comments)    Patient states she avoids NSAIDs as they cause her proctitis to flare and hemorrhoids to bleed  . Penicillin G Other (See Comments)    ALLERGY IS FROM CHILDHOOD- reaction??    Medications:  I reviewed home medications   ROS:                                                                                                                                     14 systems reviewed and negative except above    Examination:                                                                                                      General: Appears well-developed  Psych: Affect appropriate to situation Eyes: No scleral injection HENT: No OP obstrucion Head: Normocephalic.  Cardiovascular: Normal rate and regular rhythm.  Respiratory: Effort normal and breath sounds normal to anterior ascultation GI: Soft.  No distension. There is no tenderness.  Skin: WDI    Neurological Examination Mental Status: Alert, oriented, thought content appropriate.  Speech fluent without evidence of aphasia. Able to follow 3 step commands without difficulty. Cranial Nerves: II: Visual fields grossly normal,  III,IV, VI: ptosis not present, extra-ocular motions intact bilaterally, pupils equal, round, reactive to light and accommodation V,VII: left facial droop,  facial light touch sensation  normal bilaterally VIII: hearing normal bilaterally IX,X: uvula rises symmetrically XI: bilateral shoulder shrug XII: midline tongue extension Motor: Right : Upper extremity   5/5    Left:     Upper extremity   5/5, reduced speed on finger tapping   Lower extremity   5/5     Lower extremity   5/5 Tone and bulk:normal tone throughout; no atrophy noted Sensory: Pinprick and light touch intact throughout, bilaterally Deep Tendon Reflexes: 2+ and symmetric throughout Plantars: Right: downgoing   Left: downgoing Cerebellar: normal finger-to-nose, normal rapid alternating movements and normal heel-to-shin test      Lab Results: Basic Metabolic Panel: Recent Labs  Lab 06/27/20 1008 06/27/20 1051  NA 141 141  K 3.6 3.7  CL 106 106  CO2 24  --   GLUCOSE 104* 99  BUN 7 7  CREATININE 0.62 0.50  CALCIUM 8.9  --     CBC: Recent Labs  Lab 06/27/20 1008 06/27/20 1051  WBC 11.6*  --   NEUTROABS 8.5*  --   HGB 14.7 15.6*  HCT 46.1* 46.0  MCV 91.7  --   PLT 327  --     Coagulation Studies: Recent Labs    06/27/20 1008  LABPROT 12.2  INR 0.9    Imaging: CT Angio Head W or Wo Contrast  Result Date: 06/27/2020 CLINICAL DATA:  Facial tingling and  left hand tingling. Slurred speech. EXAM: CT ANGIOGRAPHY HEAD AND NECK TECHNIQUE: Multidetector CT imaging of the head and neck was performed using the standard protocol during bolus administration of intravenous contrast. Multiplanar CT image reconstructions and MIPs were obtained to evaluate the vascular anatomy. Carotid stenosis measurements (when applicable) are obtained utilizing NASCET criteria, using the distal internal carotid diameter as the denominator. CONTRAST:  75mL OMNIPAQUE IOHEXOL 350 MG/ML SOLN COMPARISON:  None. FINDINGS: CTA NECK FINDINGS SKELETON: There is no bony spinal canal stenosis. No lytic or blastic lesion. OTHER NECK: Normal pharynx, larynx and major salivary glands. No cervical lymphadenopathy. Unremarkable  thyroid gland. UPPER CHEST: Cystic structure of the left posterior mediastinum measures 1.9 cm. AORTIC ARCH: There is no calcific atherosclerosis of the aortic arch. There is no aneurysm, dissection or hemodynamically significant stenosis of the visualized portion of the aorta. Conventional 3 vessel aortic branching pattern. The visualized proximal subclavian arteries are widely patent. RIGHT CAROTID SYSTEM: Normal without aneurysm, dissection or stenosis. LEFT CAROTID SYSTEM: Normal without aneurysm, dissection or stenosis. VERTEBRAL ARTERIES: Codominant configuration. Both origins are clearly patent. There is no dissection, occlusion or flow-limiting stenosis to the skull base (V1-V3 segments). CTA HEAD FINDINGS POSTERIOR CIRCULATION: --Vertebral arteries: Normal V4 segments. --Inferior cerebellar arteries: Normal. 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 --Superior cerebellar arteries: Normal. --Posterior cerebral arteries (PCA): Normal. ANTERIOR CIRCULATION: --Intracranial internal carotid arteries: Normal. --Anterior cerebral arteries (ACA): Normal. Both A1 segments are present. Patent anterior communicating artery (a-comm). --Middle cerebral arteries (MCA): Normal. VENOUS SINUSES: As permitted by contrast timing, patent. ANATOMIC VARIANTS: None Review of the MIP images confirms the above findings. IMPRESSION: 1. No emergent large vessel occlusion or high-grade stenosis of the intracranial or cervical arteries. 2. 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 3. 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 Electronically Signed   By: Deatra RobinsonKevin  Herman M.D.   On: 06/27/2020 19:05   CT HEAD WO CONTRAST  Result Date: 06/27/2020 CLINICAL DATA:  Slurred speech with left upper extremity tingling sensations EXAM: CT HEAD WITHOUT CONTRAST TECHNIQUE: Contiguous axial images were obtained from the base of the  skull through the vertex without intravenous contrast. COMPARISON:  None. FINDINGS: Brain: Ventricles and sulci are normal in size and configuration. There is an apparent arachnoid cyst in the medial left temporal lobe measuring 1.1 x 0.8 cm. No other evident mass. No hemorrhage, extra-axial fluid collection, or midline shift. There is mild small vessel disease in the centra semiovale bilaterally. There is an area of rather ill-defined decreased attenuation in the white matter of the inferior anterior to mid right centrum semiovale which may represent a recent white matter infarct. No other findings suggesting potential recent/acute infarct. Vascular: No hyperdense vessel. There is slight calcification in each carotid siphon region. Skull: The bony calvarium appears intact. Sinuses/Orbits: There is opacification in several ethmoid air cells on the right posteriorly. There is mucosal thickening in several other ethmoid air cells. Orbits appear symmetric bilaterally. Other: Mastoid air cells are clear. IMPRESSION: 1. Mild periventricular small vessel disease. An area of rather subtle ill-defined decreased attenuation in the anterior inferior right centrum semiovale may represent a recent and possibly acute white matter infarct. No other findings suggesting potential acute infarct by noncontrast enhanced study. No hemorrhage. 2. 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 No other evident mass. 3.  Slight arterial vascular calcification. 4.  Foci of  ethmoid sinus disease noted, more severe on the right. Electronically Signed   By: Bretta Bang III M.D.   On: 06/27/2020 10:43   CT Angio Neck W and/or Wo Contrast  Result Date: 06/27/2020 CLINICAL DATA:  Facial tingling and left hand tingling. Slurred speech. EXAM: CT ANGIOGRAPHY HEAD AND NECK TECHNIQUE: Multidetector CT imaging of the head and neck was performed using the standard protocol during bolus administration of intravenous  contrast. Multiplanar CT image reconstructions and MIPs were obtained to evaluate the vascular anatomy. Carotid stenosis measurements (when applicable) are obtained utilizing NASCET criteria, using the distal internal carotid diameter as the denominator. CONTRAST:  77mL OMNIPAQUE IOHEXOL 350 MG/ML SOLN COMPARISON:  None. FINDINGS: CTA NECK FINDINGS SKELETON: There is no bony spinal canal stenosis. No lytic or blastic lesion. OTHER NECK: Normal pharynx, larynx and major salivary glands. No cervical lymphadenopathy. Unremarkable thyroid gland. UPPER CHEST: Cystic structure of the left posterior mediastinum measures 1.9 cm. AORTIC ARCH: There is no calcific atherosclerosis of the aortic arch. There is no aneurysm, dissection or hemodynamically significant stenosis of the visualized portion of the aorta. Conventional 3 vessel aortic branching pattern. The visualized proximal subclavian arteries are widely patent. RIGHT CAROTID SYSTEM: Normal without aneurysm, dissection or stenosis. LEFT CAROTID SYSTEM: Normal without aneurysm, dissection or stenosis. VERTEBRAL ARTERIES: Codominant configuration. Both origins are clearly patent. There is no dissection, occlusion or flow-limiting stenosis to the skull base (V1-V3 segments). CTA HEAD FINDINGS POSTERIOR CIRCULATION: --Vertebral arteries: Normal V4 segments. --Inferior cerebellar arteries: Normal. 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 --Superior cerebellar arteries: Normal. --Posterior cerebral arteries (PCA): Normal. ANTERIOR CIRCULATION: --Intracranial internal carotid arteries: Normal. --Anterior cerebral arteries (ACA): Normal. Both A1 segments are present. Patent anterior communicating artery (a-comm). --Middle cerebral arteries (MCA): Normal. VENOUS SINUSES: As permitted by contrast timing, patent. ANATOMIC VARIANTS: None Review of the MIP images confirms the above findings. IMPRESSION: 1. No emergent large vessel occlusion or  high-grade stenosis of the intracranial or cervical arteries. 2. 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 3. 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 Electronically Signed   By: Deatra Robinson M.D.   On: 06/27/2020 19:05   MR BRAIN WO CONTRAST  Result Date: 06/27/2020 CLINICAL DATA:  59 year old female with neurologic deficit. EXAM: MRI HEAD WITHOUT CONTRAST TECHNIQUE: Multiplanar, multiecho pulse sequences of the brain and surrounding structures were obtained without intravenous contrast. COMPARISON:  CT head without contrast 1038 hours today. CTA head and neck this evening. FINDINGS: Brain: Patchy white matter restricted diffusion in an area of 16 mm at the right corona radiata (series 7, image 58). Associated T2 and FLAIR hyperintensity. No hemorrhage or mass effect. No other restricted diffusion. Mild to moderate scattered additional bilateral white matter T2 and FLAIR hyperintensity. The deep gray nuclei are relatively spared. Perivascular spaces suspected along the caudal lentiform more so the left. No cortical encephalomalacia or chronic cerebral blood products identified. No midline shift, mass effect, evidence of mass lesion, ventriculomegaly, extra-axial collection or acute intracranial hemorrhage. Cervicomedullary junction and pituitary are within normal limits. Negative brainstem and cerebellum. Vascular: Major intracranial vascular flow voids are preserved, with dolichoectatic basilar artery again noted. Skull and upper cervical spine: Negative. Visualized bone marrow signal is within normal limits. Sinuses/Orbits: Negative orbits. Paranasal sinuses and mastoids are stable and well pneumatized. Other: Visible internal auditory structures appear normal. Visible scalp and face appear negative. IMPRESSION: 1. Patchy acute white matter infarct  in the right corona radiata. No associated hemorrhage or mass effect. 2.  Underlying mild to moderate additional nonspecific cerebral white matter signal changes, favor due to chronic small vessel disease. Electronically Signed   By: Odessa Fleming M.D.   On: 06/27/2020 19:21     ASSESSMENT AND PLAN  59 year old female with past medical history for ocular migraines, PVC, uterine cancer, untreated hypertension presents to the emergency department with 1 day history of left-sided facial droop left-sided weakness along with slurred speech secondary to acute ischemic stroke likely secondary small vessel disease. MRI brain shows right corona radiata infarction and CTA negative for large vessel occlusion/stenosis. . Acute Ischemic Stroke   Recommend # MRI of the brain without contrast #MRA Head and neck  #Transthoracic Echo  # Start patient on ASA 81 mg and plavix 75 mg daily x 3 weeks, then ASA alone  #Start or continue Atorvastatin 80 mg/other high intensity statin # BP goal: permissive HTN upto 220/120 mmHg ( 185/110 if patient has CHF, CKD) # HBAIC and Lipid profile # Telemetry monitoring # Frequent neuro checks # NPO until passes stroke swallow screen  Please page stroke NP  Or  PA  Or MD from 8am -4 pm  as this patient from this time will be  followed by the stroke.   You can look them up on [email or web address]  Password [facility]   Donyae Kohn [facility] Pager Number [phone]

## 2020-06-29 NOTE — Discharge Summary (Signed)
Name: Christina Lloyd MRN: [id number] DOB: 06-23-1961 59 y.o. PCP: Geoffry Paradise, MD  Date of Admission: 06/27/2020 10:02 AM Date of Discharge: 06/28/2020 Attending Physician: No att. providers found  Discharge Diagnosis: 1. 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   Nsaids Other (See Comments)   Patient states she avoids NSAIDs as they cause her proctitis to flare and hemorrhoids to bleed   Penicillin G Other (See Comments)   ALLERGY IS FROM CHILDHOOD- reaction??      Medication List    STOP taking these medications   aspirin 81 MG chewable tablet Replaced by: aspirin 81 MG EC tablet   estradiol 0.1 MG/24HR patch Commonly known as: VIVELLE-DOT   MELATONIN PO   VITAMIN D PO     TAKE these medications   acetaminophen 325 MG tablet Commonly known as: TYLENOL Take 650 mg by mouth in the morning and at bedtime. What changed: Another medication with the same name was removed. Continue taking this medication, and follow the directions you see here.   aspirin 81 MG EC tablet Take 1 tablet (81 mg total) by mouth daily. Swallow whole. Replaces: aspirin 81 MG chewable tablet   atorvastatin 40 MG tablet Commonly known as: LIPITOR Take 1 tablet (40 mg total) by mouth daily.   clopidogrel 75 MG tablet Commonly known as: PLAVIX Take 1 tablet (75 mg total) by mouth daily.   Flonase 50 MCG/ACT nasal spray Generic drug: fluticasone Place 1-2 sprays into both nostrils daily as needed for allergies or rhinitis.   loratadine 10 MG tablet Commonly known as: CLARITIN Take 10 mg by mouth daily as needed for allergies.   Melatonin 10 MG Tabs Take 10 mg by mouth at bedtime.   Tavaborole 5 % Soln Apply 1 drop topically daily. What changed:   when to take this  reasons to take this   Turmeric Curcumin Caps Take 1  capsule by mouth 2 (two) times daily.   Vitamin D-3 125 MCG (5000 UT) Tabs Take 5,000 Units by mouth daily.   zolpidem 10 MG tablet Commonly known as: AMBIEN TAKE ONE TABLET BY MOUTH EVERY NIGHT AT BEDTIME AS NEEDED FOR SLEEP What changed:   how much to take  how to take this  when to take this  additional instructions       Disposition and follow-up:   [name] was discharged from [facility] in Stable condition.  At the hospital follow up visit please address:  1.  PCP please reevaluete HTN 2. PCP please evaluate patient's concerns for peripheral neuropathy and B12 levels 3.  Please evaluate patient for possible ocular migraines 4. OBGYN: please reevaluate patient post stroke and discontinuing exogenous estrogen 5. Please reassess patient R groin pain 6. Please reassess patient compliance concerning new atorvastatin 80, ASA daily, Plavix ( 3 weeks), and discontinuing estrogen.    Labs / imaging needed at time of follow-up: None   Pending labs/ test needing follow-up: None  Follow-up Appointments: 07/11/2020- [facility] 3/07/2022[facility] Course by problem list: - PCP please reevaluete HTN - PCP please evaluate patient's concerns for peripheral neuropathy and B12 levels - Neuro: please evaluate patient for possible ocular migraines - OBGYN: please reevaluate patient post stroke and discontinuing exogenous estrogen  Christina Lloyd is a 59 y/o F with  a PMHx of PVC's, urinary incontinence, and patient notes undiagnosed hypertension, presents to the ED from urgent care for numbness/tingling of 1st/2nd digit, dysarthria, left sided facial droop and drooling for the past day. She initially presented to urgent care and they sent the patient to the ED for acute stroke workup.    Acute CVA She had CT Head, CTA Head and Neck, MRI Brain, MRA Head and Neck. It was concluded from imaging that the patient suffered from a  white matter infarct in the right corona radiata with no hemorrhage or mass effect. Official diagnosis was Lacunar infarct. Patient was admitted for stroke workup. MRA Head and neck was negative for emergent findings or stenosis. Patient hx was significant for HTN untreated, former smoker,  exogenous estrogen, and ocular migraines. Patient underwent echo which was negative for cardiac source of embolism. Patient neurological exam improved over hospitalization and although patient continued to have facial droop and some dysarthria. Patient was also started on atorvastatin 80, ASA and Plavix. Patient instructed to take her Plavix for 3 weeks and then ASA alone after.    History of High Blood Pressure Patient was started on amlodipine . Patient sys remained less that 180 and stable.    Right Groin Pain Patient reported some right groin pain since 04/2020. On physical exam patient has pain with flexion at the hip and has a history of doing difficulty stretches. Patient had normal gait and physical exam was not concerning for clot. Patient remained able to manipulate her body and had 5/5 strength in her hips bilaterally.     Discharge Vitals:   BP (!) 166/100 (BP Location: Left Arm)   Pulse 99   Temp 98.1 F (36.7 C) (Oral)   Resp 16   SpO2 100%   Pertinent Labs, Studies, and Procedures:  CT Angio Head W or Wo Contrast  Result Date: 06/27/2020 CLINICAL DATA:  Facial tingling and left hand tingling. Slurred speech. EXAM: CT ANGIOGRAPHY HEAD AND NECK TECHNIQUE: Multidetector CT imaging of the head and neck was performed using the standard protocol during bolus administration of intravenous contrast. Multiplanar CT image reconstructions and MIPs were obtained to evaluate the vascular anatomy. Carotid stenosis measurements (when applicable) are obtained utilizing NASCET criteria, using the distal internal carotid diameter as the denominator. CONTRAST:  75mL OMNIPAQUE IOHEXOL 350 MG/ML SOLN COMPARISON:   None. FINDINGS: CTA NECK FINDINGS SKELETON: There is no bony spinal canal stenosis. No lytic or blastic lesion. OTHER NECK: Normal pharynx, larynx and major salivary glands. No cervical lymphadenopathy. Unremarkable thyroid gland. UPPER CHEST: Cystic structure of the left posterior mediastinum measures 1.9 cm. AORTIC ARCH: There is no calcific atherosclerosis of the aortic arch. There is no aneurysm, dissection or hemodynamically significant stenosis of the visualized portion of the aorta. Conventional 3 vessel aortic branching pattern. The visualized proximal subclavian arteries are widely patent. RIGHT CAROTID SYSTEM: Normal without aneurysm, dissection or stenosis. LEFT CAROTID SYSTEM: Normal without aneurysm, dissection or stenosis. VERTEBRAL ARTERIES: Codominant configuration. Both origins are clearly patent. There is no dissection, occlusion or flow-limiting stenosis to the skull base (V1-V3 segments). CTA HEAD FINDINGS POSTERIOR CIRCULATION: --Vertebral arteries: Normal V4 segments. --Inferior cerebellar arteries: Normal. 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 --Superior cerebellar arteries: Normal. --Posterior cerebral arteries (PCA): Normal. ANTERIOR CIRCULATION: --Intracranial internal carotid arteries: Normal. --Anterior cerebral arteries (ACA): Normal. Both A1 segments are present. Patent anterior communicating artery (a-comm). --Middle cerebral arteries (MCA): Normal. VENOUS SINUSES: As permitted by contrast timing, patent. ANATOMIC VARIANTS:  None Review of the MIP images confirms the above findings. IMPRESSION: 1. No emergent large vessel occlusion or high-grade stenosis of the intracranial or cervical arteries. 2. 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 3. 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 Electronically Signed   By: Deatra RobinsonKevin  Herman M.D.   On: 06/27/2020 19:05   CT HEAD WO  CONTRAST  Result Date: 06/27/2020 CLINICAL DATA:  Slurred speech with left upper extremity tingling sensations EXAM: CT HEAD WITHOUT CONTRAST TECHNIQUE: Contiguous axial images were obtained from the base of the skull through the vertex without intravenous contrast. COMPARISON:  None. FINDINGS: Brain: Ventricles and sulci are normal in size and configuration. There is an apparent arachnoid cyst in the medial left temporal lobe measuring 1.1 x 0.8 cm. No other evident mass. No hemorrhage, extra-axial fluid collection, or midline shift. There is mild small vessel disease in the centra semiovale bilaterally. There is an area of rather ill-defined decreased attenuation in the white matter of the inferior anterior to mid right centrum semiovale which may represent a recent white matter infarct. No other findings suggesting potential recent/acute infarct. Vascular: No hyperdense vessel. There is slight calcification in each carotid siphon region. Skull: The bony calvarium appears intact. Sinuses/Orbits: There is opacification in several ethmoid air cells on the right posteriorly. There is mucosal thickening in several other ethmoid air cells. Orbits appear symmetric bilaterally. Other: Mastoid air cells are clear. IMPRESSION: 1. Mild periventricular small vessel disease. An area of rather subtle ill-defined decreased attenuation in the anterior inferior right centrum semiovale may represent a recent and possibly acute white matter infarct. No other findings suggesting potential acute infarct by noncontrast enhanced study. No hemorrhage. 2. 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 No other evident mass. 3.  Slight arterial vascular calcification. 4.  Foci of ethmoid sinus disease noted, more severe on the right. Electronically Signed   By: Bretta BangWilliam  Woodruff III M.D.   On: 06/27/2020 10:43   CT Angio Neck W and/or Wo Contrast  Result Date: 06/27/2020 CLINICAL DATA:  Facial tingling and  left hand tingling. Slurred speech. EXAM: CT ANGIOGRAPHY HEAD AND NECK TECHNIQUE: Multidetector CT imaging of the head and neck was performed using the standard protocol during bolus administration of intravenous contrast. Multiplanar CT image reconstructions and MIPs were obtained to evaluate the vascular anatomy. Carotid stenosis measurements (when applicable) are obtained utilizing NASCET criteria, using the distal internal carotid diameter as the denominator. CONTRAST:  75mL OMNIPAQUE IOHEXOL 350 MG/ML SOLN COMPARISON:  None. FINDINGS: CTA NECK FINDINGS SKELETON: There is no bony spinal canal stenosis. No lytic or blastic lesion. OTHER NECK: Normal pharynx, larynx and major salivary glands. No cervical lymphadenopathy. Unremarkable thyroid gland. UPPER CHEST: Cystic structure of the left posterior mediastinum measures 1.9 cm. AORTIC ARCH: There is no calcific atherosclerosis of the aortic arch. There is no aneurysm, dissection or hemodynamically significant stenosis of the visualized portion of the aorta. Conventional 3 vessel aortic branching pattern. The visualized proximal subclavian arteries are widely patent. RIGHT CAROTID SYSTEM: Normal without aneurysm, dissection or stenosis. LEFT CAROTID SYSTEM: Normal without aneurysm, dissection or stenosis. VERTEBRAL ARTERIES: Codominant configuration. Both origins are clearly patent. There is no dissection, occlusion or flow-limiting stenosis to the skull base (V1-V3 segments). CTA HEAD FINDINGS POSTERIOR CIRCULATION: --Vertebral arteries: Normal V4 segments. --Inferior cerebellar arteries: Normal. 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 --Superior cerebellar arteries:  Normal. --Posterior cerebral arteries (PCA): Normal. ANTERIOR CIRCULATION: --Intracranial internal carotid arteries: Normal. --Anterior cerebral arteries (ACA): Normal. Both A1 segments are present. Patent anterior communicating artery (a-comm). --Middle cerebral  arteries (MCA): Normal. VENOUS SINUSES: As permitted by contrast timing, patent. ANATOMIC VARIANTS: None Review of the MIP images confirms the above findings. IMPRESSION: 1. No emergent large vessel occlusion or high-grade stenosis of the intracranial or cervical arteries. 2. 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 3. 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 Electronically Signed   By: Deatra Robinson M.D.   On: 06/27/2020 19:05   MR ANGIO HEAD WO CONTRAST  Result Date: 06/28/2020 CLINICAL DATA:  Acute infarcts. EXAM: MRA HEAD WITHOUT CONTRAST TECHNIQUE: Angiographic images of the Circle of Willis were obtained using MRA technique without intravenous contrast. COMPARISON:  None. FINDINGS: The carotid and vertebral arteries are widely patent. The basilar is actually patulous at 5-6 mm diameter, which may be a sign of chronic hypertension. No branch occlusion, beading, or aneurysm. IMPRESSION: No emergent finding or stenosis. Electronically Signed   By: Marnee Spring M.D.   On: 06/28/2020 09:52   MR ANGIO NECK W WO CONTRAST  Result Date: 06/28/2020 CLINICAL DATA:  Acute stroke EXAM: MRA NECK WITHOUT AND WITH CONTRAST TECHNIQUE: Multiplanar and multiecho pulse sequences of the neck were obtained without and with intravenous contrast. Angiographic images of the neck were obtained using MRA technique without and with intravenous contrast. CONTRAST:  41mL GADAVIST GADOBUTROL 1 MMOL/ML IV SOLN COMPARISON:  None. FINDINGS: Normal arch with 3 vessel branching. Antegrade flow in both carotid and vertebral arteries. Neck mask is unremarkable. The carotid and vertebral arteries are smooth and widely patent on both sides. IMPRESSION: Negative neck MRA. Electronically Signed   By: Marnee Spring M.D.   On: 06/28/2020 09:57   MR BRAIN WO CONTRAST  Result Date: 06/27/2020 CLINICAL DATA:  59 year old female with neurologic deficit.  EXAM: MRI HEAD WITHOUT CONTRAST TECHNIQUE: Multiplanar, multiecho pulse sequences of the brain and surrounding structures were obtained without intravenous contrast. COMPARISON:  CT head without contrast 1038 hours today. CTA head and neck this evening. FINDINGS: Brain: Patchy white matter restricted diffusion in an area of 16 mm at the right corona radiata (series 7, image 58). Associated T2 and FLAIR hyperintensity. No hemorrhage or mass effect. No other restricted diffusion. Mild to moderate scattered additional bilateral white matter T2 and FLAIR hyperintensity. The deep gray nuclei are relatively spared. Perivascular spaces suspected along the caudal lentiform more so the left. No cortical encephalomalacia or chronic cerebral blood products identified. No midline shift, mass effect, evidence of mass lesion, ventriculomegaly, extra-axial collection or acute intracranial hemorrhage. Cervicomedullary junction and pituitary are within normal limits. Negative brainstem and cerebellum. Vascular: Major intracranial vascular flow voids are preserved, with dolichoectatic basilar artery again noted. Skull and upper cervical spine: Negative. Visualized bone marrow signal is within normal limits. Sinuses/Orbits: Negative orbits. Paranasal sinuses and mastoids are stable and well pneumatized. Other: Visible internal auditory structures appear normal. Visible scalp and face appear negative. IMPRESSION: 1. Patchy acute white matter infarct in the right corona radiata. No associated hemorrhage or mass effect. 2. Underlying mild to moderate additional nonspecific cerebral white matter signal changes, favor due to chronic small vessel disease. Electronically Signed   By: Odessa Fleming M.D.   On: 06/27/2020 19:21   ECHOCARDIOGRAM COMPLETE  Result Date: 06/28/2020    ECHOCARDIOGRAM REPORT   Patient Name:  Johny Shears Date of Exam: 06/28/2020 Medical Rec #:  [id number]        Height:       65.5 in Accession #:    [id number]        Weight:       175.0 lb Date of Birth:  25-Jul-1961        BSA:          1.880 m Patient Age:    59 years         BP:           148/98 mmHg Patient Gender: F                HR:           97 bpm. Exam Location:  Inpatient Procedure: 2D Echo Indications:    435.9 TIA  History:        Patient has no prior history of Echocardiogram examinations.                 Risk Factors:Former Smoker. PVCs.  Sonographer:    Celene Skeen RDCS (AE) Referring Phys: [id number] JULIE ANNE WILLIAMS IMPRESSIONS  1. Left ventricular ejection fraction, by estimation, is 60 to 65%. The left ventricle has normal function. The left ventricle has no regional wall motion abnormalities. There is mild left ventricular hypertrophy. Left ventricular diastolic parameters are indeterminate.  2. Right ventricular systolic function is normal. The right ventricular size is normal.  3. The mitral valve is abnormal. Trivial mitral valve regurgitation.  4. The aortic valve is normal in structure. Aortic valve regurgitation is not visualized. FINDINGS  Left Ventricle: Left ventricular ejection fraction, by estimation, is 60 to 65%. The left ventricle has normal function. The left ventricle has no regional wall motion abnormalities. The left ventricular internal cavity size was normal in size. There is  mild left ventricular hypertrophy. Left ventricular diastolic parameters are indeterminate. Right Ventricle: The right ventricular size is normal. No increase in right ventricular wall thickness. Right ventricular systolic function is normal. Left Atrium: Left atrial size was normal in size. Right Atrium: Right atrial size was normal in size. Pericardium: There is no evidence of pericardial effusion. Mitral Valve: The mitral valve is abnormal. Mild mitral annular calcification. Trivial mitral valve regurgitation. Tricuspid Valve: The tricuspid valve is normal in structure. Tricuspid valve regurgitation is trivial. Aortic Valve: The aortic valve is normal in structure.  Aortic valve regurgitation is not visualized. Pulmonic Valve: The pulmonic valve was not well visualized. Pulmonic valve regurgitation is not visualized. Aorta: The aortic root is normal in size and structure. IAS/Shunts: The interatrial septum was not assessed.  LEFT VENTRICLE PLAX 2D LVIDd:         2.97 cm  Diastology LVIDs:         2.34 cm  LV e' medial:    6.20 cm/s LV PW:         1.34 cm  LV E/e' medial:  8.8 LV IVS:        1.33 cm  LV e' lateral:   8.27 cm/s LVOT diam:     2.00 cm  LV E/e' lateral: 6.6 LV SV:         63 LV SV Index:   34 LVOT Area:     3.14 cm  RIGHT VENTRICLE RV S prime:     12.50 cm/s TAPSE (M-mode): 1.6 cm LEFT ATRIUM             Index LA diam:  2.50 cm 1.33 cm/m LA Vol (A2C):   21.7 ml 11.54 ml/m LA Vol (A4C):   49.8 ml 26.49 ml/m LA Biplane Vol: 36.5 ml 19.42 ml/m  AORTIC VALVE LVOT Vmax:   104.00 cm/s LVOT Vmean:  65.300 cm/s LVOT VTI:    0.201 m  AORTA Ao Root diam: 3.30 cm MITRAL VALVE MV Area (PHT): 2.80 cm    SHUNTS MV Decel Time: 271 msec    Systemic VTI:  0.20 m MV E velocity: 54.40 cm/s  Systemic Diam: 2.00 cm MV A velocity: 90.00 cm/s MV E/A ratio:  0.60 Dietrich Pates MD Electronically signed by Dietrich Pates MD Signature Date/Time: 06/28/2020/3:05:07 PM    Final     Discharge Instructions: Discharge Instructions    Diet - low sodium heart healthy   Complete by: As directed    Increase activity slowly   Complete by: As directed       Signed: Bobbye Morton, MD 06/29/2020, 2:36 PM   Pager: [phone]

## 2020-07-04 ENCOUNTER — Other Ambulatory Visit: Payer: Self-pay | Admitting: Obstetrics and Gynecology

## 2020-07-11 ENCOUNTER — Encounter: Payer: 59 | Admitting: Internal Medicine

## 2020-07-11 ENCOUNTER — Telehealth: Payer: Self-pay | Admitting: Internal Medicine

## 2020-07-11 NOTE — Telephone Encounter (Signed)
TOC HFU APPT 07/16/2020 WITH DR COE @ 2:45PM PER DR Gwyneth Revels

## 2020-07-13 NOTE — Telephone Encounter (Signed)
TC placed for TOC f/u, no answer. SChaplin, RN,BSN

## 2020-07-16 ENCOUNTER — Ambulatory Visit (INDEPENDENT_AMBULATORY_CARE_PROVIDER_SITE_OTHER): Payer: 59 | Admitting: Internal Medicine

## 2020-07-16 ENCOUNTER — Other Ambulatory Visit: Payer: Self-pay

## 2020-07-16 ENCOUNTER — Encounter: Payer: Self-pay | Admitting: Internal Medicine

## 2020-07-16 VITALS — BP 138/91 | HR 96 | Temp 98.2°F | Ht 66.0 in | Wt 173.7 lb

## 2020-07-16 DIAGNOSIS — N951 Menopausal and female climacteric states: Secondary | ICD-10-CM

## 2020-07-16 DIAGNOSIS — I10 Essential (primary) hypertension: Secondary | ICD-10-CM | POA: Diagnosis not present

## 2020-07-16 DIAGNOSIS — I639 Cerebral infarction, unspecified: Secondary | ICD-10-CM | POA: Diagnosis not present

## 2020-07-16 DIAGNOSIS — E782 Mixed hyperlipidemia: Secondary | ICD-10-CM | POA: Diagnosis not present

## 2020-07-16 DIAGNOSIS — G43009 Migraine without aura, not intractable, without status migrainosus: Secondary | ICD-10-CM

## 2020-07-16 MED ORDER — AMLODIPINE BESYLATE 5 MG PO TABS: 5.0000 mg | ORAL_TABLET | Freq: Every day | ORAL | 11 refills | Status: DC

## 2020-07-16 MED ORDER — GABAPENTIN 100 MG PO CAPS: 100.0000 mg | ORAL_CAPSULE | Freq: Three times a day (TID) | ORAL | 2 refills | Status: DC

## 2020-07-16 MED ORDER — DICLOFENAC SODIUM 1 % EX GEL: 4.0000 g | Freq: Four times a day (QID) | CUTANEOUS | 0 refills | Status: DC

## 2020-07-16 NOTE — Progress Notes (Signed)
CC: HTN  HPI:  [name] is a 59 y.o. female with a past medical history stated below and presents today for hospital follow-up for ischemic infarct and hypertension. Please see problem based assessment and plan for additional details.  Past Medical History:  Diagnosis Date  . Adenocarcinoma in situ (AIS) of uterine cervix 2012  . Blepharitis   . History of shingles   . Ocular migraine   . Osteopenia 08/2018   T score -1.1 FRAX 22% / 0.7%  . Proctitis   . PVC (premature ventricular contraction)   . Urinary incontinence   . 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 acetaminophen (TYLENOL) 325 MG tablet Take 650 mg by mouth in the morning and at bedtime.    Marland Kitchen aspirin EC 81 MG EC tablet Take 1 tablet (81 mg total) by mouth daily. Swallow whole. 30 tablet 11  . atorvastatin (LIPITOR) 40 MG tablet Take 1 tablet (40 mg total) by mouth daily. 30 tablet 0  . Cholecalciferol (VITAMIN D-3) 125 MCG (5000 UT) TABS Take 5,000 Units by mouth daily.    . clopidogrel (PLAVIX) 75 MG tablet Take 1 tablet (75 mg total) by mouth daily. 30 tablet 0  . fluticasone (FLONASE) 50 MCG/ACT nasal spray Place 1-2 sprays into both nostrils daily as needed for allergies or rhinitis.     Marland Kitchen loratadine (CLARITIN) 10 MG tablet Take 10 mg by mouth daily as needed for allergies.     . Melatonin 10 MG TABS Take 10 mg by mouth at bedtime.    . Misc Natural Products (TURMERIC CURCUMIN) CAPS Take 1 capsule by mouth 2 (two) times daily.    . Tavaborole 5 % SOLN Apply 1 drop topically daily. (Patient taking differently: Apply 1 drop topically daily as needed (for antifungal issues- to affected toes). ) 10 mL 6  . zolpidem (AMBIEN) 10 MG tablet TAKE ONE TABLET BY MOUTH EVERY NIGHT AT BEDTIME AS NEEDED FOR SLEEP 30 tablet 5   No current  facility-administered medications on file prior to visit.    Family History  Problem Relation Age of Onset  . Heart disease Mother   . Alzheimer's disease Mother   . Heart disease Father   . Diabetes Father   . Heart disease Maternal Grandmother   . Diabetes Maternal Grandmother   . Heart disease Maternal Grandfather   . Diabetes Maternal Grandfather   . Stroke Paternal Grandmother   . Breast cancer Paternal Grandmother 20  . Stroke Paternal Grandfather   . Thyroid disease Brother     Social History   Socioeconomic History  . Marital status: Single    Spouse name: Not on file  . Number of children: Not on file  . Years of education: Not on file  . Highest education level: Not on file  Occupational History  . Not on file  Tobacco Use  . Smoking status: Former Games developer  . Smokeless tobacco: Never Used  Vaping Use  . Vaping Use: Never used  Substance and Sexual Activity  . Alcohol use: Yes    Alcohol/week: 0.0 standard drinks  . Drug use: No  . Sexual activity: Not Currently    Birth control/protection: Surgical    Comment: HYST-1st intercourse 59 yo--More than 5 partners  Other Topics Concern  . Not on  file  Social History Narrative  . Not on file   Social Determinants of Health   Financial Resource Strain:   . Difficulty of Paying Living Expenses: Not on file  Food Insecurity:   . Worried About Programme researcher, broadcasting/film/video in the Last Year: Not on file  . Ran Out of Food in the Last Year: Not on file  Transportation Needs:   . Lack of Transportation (Medical): Not on file  . Lack of Transportation (Non-Medical): Not on file  Physical Activity:   . Days of Exercise per Week: Not on file  . Minutes of Exercise per Session: Not on file  Stress:   . Feeling of Stress : Not on file  Social Connections:   . Frequency of Communication with Friends and Family: Not on file  . Frequency of Social Gatherings with Friends and Family: Not on file  . Attends Religious Services:  Not on file  . Active Member of Clubs or Organizations: Not on file  . Attends Banker Meetings: Not on file  . Marital Status: Not on file  Intimate Partner Violence:   . Fear of Current or Ex-Partner: Not on file  . Emotionally Abused: Not on file  . Physically Abused: Not on file  . Sexually Abused: Not on file    Review of Systems: ROS negative except for what is noted on the assessment and plan.  Vitals:   07/16/20 1442 07/16/20 1452  BP: (!) 152/89 (!) 138/91  Pulse: 99 96  Temp: 98.2 F (36.8 C)   TempSrc: Oral   SpO2: 98%   Weight: 173 lb 11.2 oz (78.8 kg)   Height: 5\' 6"  (1.676 m)      Physical Exam: Physical Exam Constitutional:      Appearance: Normal appearance.  HENT:     Head: Normocephalic and atraumatic.  Eyes:     Extraocular Movements: Extraocular movements intact.  Cardiovascular:     Rate and Rhythm: Normal rate.     Pulses: Normal pulses.     Heart sounds: Normal heart sounds.  Pulmonary:     Effort: Pulmonary effort is normal.     Breath sounds: Normal breath sounds.  Abdominal:     Tenderness: There is no abdominal tenderness.  Musculoskeletal:        General: Normal range of motion.     Right lower leg: No edema.     Left lower leg: No edema.  Skin:    General: Skin is warm and dry.  Neurological:     Mental Status: She is alert and oriented to person, place, and time. Mental status is at baseline.     Cranial Nerves: No cranial nerve deficit.     Sensory: No sensory deficit.     Motor: No weakness.     Coordination: Coordination normal.     Gait: Gait normal.  Psychiatric:        Mood and Affect: Mood normal.      Assessment & Plan:   See Encounters Tab for problem based charting.  Patient discussed with Dr. , D.O. [facility], PGY-2 Pager: [phone], Phone: [phone] Date 07/17/2020 Time 1:08 PM

## 2020-07-16 NOTE — Patient Instructions (Addendum)
Thank you, [name] for allowing [place] to provide your care today. Today we discussed recent stroke.    I have ordered the following labs for you:   Lab Orders     BMP8+Anion Gap     Vitamin B12   I will call if any are abnormal. All of your labs can be accessed through "My Chart".  I have place a referrals to none, buit make sure to follow up with neurology..  I have ordered the following tests: none   I have ordered the following medication/changed the following medications:  1. begin Amlodipine 5 mg 2. discontinue Plavix after 21 days form initiation.  3. Gabapentin 100 mg TID (titrate from 100 mg nightly and increase as needed) 4. Diclofenac topical   Please follow-up in in 1 weeks.  Should you have any questions or concerns please call the internal medicine clinic at [phone].    Dellia Cloud, D.O. [facility]   My Chart Access: [email or web address]   If you have not already done so, please get your COVID 19 vaccine  To schedule an appointment for a COVID vaccine choice any of the following: Go to [email or web address]   Go to [email or web address]                  Call [phone]                                     Call [phone] and select Option 2

## 2020-07-17 ENCOUNTER — Encounter: Payer: Self-pay | Admitting: Internal Medicine

## 2020-07-17 DIAGNOSIS — E785 Hyperlipidemia, unspecified: Secondary | ICD-10-CM | POA: Insufficient documentation

## 2020-07-17 DIAGNOSIS — G43009 Migraine without aura, not intractable, without status migrainosus: Secondary | ICD-10-CM | POA: Insufficient documentation

## 2020-07-17 DIAGNOSIS — N951 Menopausal and female climacteric states: Secondary | ICD-10-CM | POA: Insufficient documentation

## 2020-07-17 DIAGNOSIS — I10 Essential (primary) hypertension: Secondary | ICD-10-CM | POA: Insufficient documentation

## 2020-07-17 LAB — BMP8+ANION GAP
Anion Gap: 19 mmol/L — ABNORMAL HIGH (ref 10.0–18.0)
BUN/Creatinine Ratio: 15 (ref 9–23)
BUN: 13 mg/dL (ref 6–24)
CO2: 22 mmol/L (ref 20–29)
Calcium: 9.6 mg/dL (ref 8.7–10.2)
Chloride: 103 mmol/L (ref 96–106)
Creatinine, Ser: 0.84 mg/dL (ref 0.57–1.00)
GFR calc Af Amer: 88 mL/min/{1.73_m2} (ref >59)
GFR calc non Af Amer: 76 mL/min/{1.73_m2} (ref >59)
Glucose: 90 mg/dL (ref 65–99)
Potassium: 4.3 mmol/L (ref 3.5–5.2)
Sodium: 144 mmol/L (ref 134–144)

## 2020-07-17 LAB — VITAMIN B12: Vitamin B-12: 422 pg/mL (ref 232–1245)

## 2020-07-17 NOTE — Assessment & Plan Note (Addendum)
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  Patient states that the majority of her neuro deficits have resolved.  The only remaining symptoms that she was noticed is some mild numbness in the back of her tongue.  She states that this does not interfere with chewing or swallowing, although she initially had to concentrate while eating to avoid aspiration.  She states this is significantly improved since discharge.  Patient does continue to have some paresthesias in her lower extremities and is interested in getting screened for B12 deficiency. Patient was started on dual antiplatelet therapy has been tolerating it well without any signs of bleeding.  Patient states that she finished her 21-day course in the next day and will switch to single agent therapy with aspirin alone.  Patient continues to take atorvastatin 40 mg and tolerating it well.  She has not started on any antihypertensive medications prior to discharge will need to be reassessed today.  Patient was instructed to follow-up with [facility] in the coming weeks.    Plan: -Encouraged patient to continue progress was her own physical therapy. -Continue atorvastatin 40 mg daily -Continue aspirin 81 mg -Discontinue clopidogrel after 21 days. -B12 level today

## 2020-07-17 NOTE — Assessment & Plan Note (Signed)
Patient has a history of atypical migraines with visual auras.  I counseled her on the importance of talking to neurology about possible abortive therapy for migraines in the future if need be.  Currently she is not having any headache type symptoms and does not wish to initiate therapy at this time.

## 2020-07-17 NOTE — Assessment & Plan Note (Signed)
Patient presents to the clinic with an elevated blood pressure of 138/91 after recently being discharged from the hospital for acute ischemic infarct.  I counseled the patient on the importance of utilizing antihypertensive medications to reduce her risk of subsequent strokes.  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 She is agreeable to starting antihypertensive medications today.  Through joint decision-making we decided to start amlodipine 5 mg daily.  Plan: 1.  start amlodipine 5 mg daily 2.  We will follow up with patient in roughly a week to reassess her blood pressure.

## 2020-07-17 NOTE — Assessment & Plan Note (Signed)
Patient states that she continues to have significant vasomotor symptoms secondary to menopause.  She states that she has had the symptoms for upwards of 9 years and was previously on systemic hormonal therapy with improvement of her symptoms.  Considering her most recent stroke, patient would not qualify for further systemic estrogen treatment.  The patient is well aware of this fact.  She inquired about using gabapentin to help her nighttime vasomotor symptoms.  I counseled her on how to appropriately use gabapentin for this reason we will start her on 100 mg daily and asked her to titrate up to 300 mg daily over the coming week to treat her vasomotor symptoms.   Plan: -Start gabapentin 100 mg and titrate up to 300 mg daily over the next week.

## 2020-07-17 NOTE — Assessment & Plan Note (Signed)
Patient was started on atorvastatin 40 mg for secondary prevention of peripheral artery disease and is tolerating it well.  Plan: -Continue atorvastatin 40 mg -We will need to repeat lipid panel in the future to reassess her prog

## 2020-07-17 NOTE — Progress Notes (Signed)
Internal Medicine Clinic Attending  Case discussed with Dr. Coe  At the time of the visit.  We reviewed the resident's history and exam and pertinent patient test results.  I agree with the assessment, diagnosis, and plan of care documented in the resident's note.  

## 2020-07-30 ENCOUNTER — Ambulatory Visit (INDEPENDENT_AMBULATORY_CARE_PROVIDER_SITE_OTHER): Payer: 59 | Admitting: Internal Medicine

## 2020-07-30 ENCOUNTER — Encounter: Payer: Self-pay | Admitting: Internal Medicine

## 2020-07-30 ENCOUNTER — Other Ambulatory Visit: Payer: Self-pay

## 2020-07-30 VITALS — BP 126/75 | HR 100 | Temp 97.6°F | Ht 66.0 in | Wt 172.0 lb

## 2020-07-30 DIAGNOSIS — I639 Cerebral infarction, unspecified: Secondary | ICD-10-CM

## 2020-07-30 DIAGNOSIS — I10 Essential (primary) hypertension: Secondary | ICD-10-CM

## 2020-07-30 DIAGNOSIS — E782 Mixed hyperlipidemia: Secondary | ICD-10-CM

## 2020-07-30 DIAGNOSIS — E559 Vitamin D deficiency, unspecified: Secondary | ICD-10-CM | POA: Diagnosis not present

## 2020-07-30 DIAGNOSIS — N951 Menopausal and female climacteric states: Secondary | ICD-10-CM

## 2020-07-30 MED ORDER — GABAPENTIN 300 MG PO CAPS: 300.0000 mg | ORAL_CAPSULE | Freq: Three times a day (TID) | ORAL | 2 refills | Status: DC

## 2020-07-30 MED ORDER — ATORVASTATIN CALCIUM 40 MG PO TABS: 40.0000 mg | ORAL_TABLET | Freq: Every day | ORAL | 0 refills | Status: DC

## 2020-07-30 NOTE — Patient Instructions (Signed)
Thank you, [name] for allowing [place] to provide your care today. Today we discussed blood pressure, hyperlipidemia, and vitamin D deficiency.    I have ordered the following labs for you:   Lab Orders     Vitamin D (25 hydroxy)   I will call if any are abnormal. All of your labs can be accessed through "My Chart".  Referrals and tests ordered today:   Referral Orders  No referral(s) requested today     I have ordered the following medication/changed the following medications:   Medications Discontinued During This Encounter  Medication Reason  . clopidogrel (PLAVIX) 75 MG tablet Completed Course  . gabapentin (NEURONTIN) 100 MG capsule   . atorvastatin (LIPITOR) 40 MG tablet Reorder     Meds ordered this encounter  Medications  . atorvastatin (LIPITOR) 40 MG tablet    Sig: Take 1 tablet (40 mg total) by mouth daily.    Dispense:  30 tablet    Refill:  0  . gabapentin (NEURONTIN) 300 MG capsule    Sig: Take 1 capsule (300 mg total) by mouth 3 (three) times daily.    Dispense:  90 capsule    Refill:  2     If you vitamin D is normal, then please stop taking Vitamin D supplementation.   Follow up: 6 months to a year.    Remember:   Should you have any questions or concerns please call the internal medicine clinic at [phone].     Dellia Cloud, D.O. [facility]

## 2020-07-31 ENCOUNTER — Encounter: Payer: Self-pay | Admitting: Internal Medicine

## 2020-07-31 DIAGNOSIS — E559 Vitamin D deficiency, unspecified: Secondary | ICD-10-CM | POA: Insufficient documentation

## 2020-07-31 LAB — VITAMIN D 25 HYDROXY (VIT D DEFICIENCY, FRACTURES): Vit D, 25-Hydroxy: 68.6 ng/mL (ref 30.0–100.0)

## 2020-07-31 NOTE — Assessment & Plan Note (Signed)
Patient presents for reevaluation for hypertension.  Her blood pressure is 126/75 on amlodipine 5 mg.  She is tolerating the medication well without any side effects.   Plan 1.  Continue amlodipine 5 mg daily.

## 2020-07-31 NOTE — Assessment & Plan Note (Signed)
Patient has a history of vitamin D deficiency currently on vitamin D supplementation of 5000 units daily.  Her last vitamin D level was elevated and therefore I will repeat her vitamin D today per the patient's request.  If the vitamin D remains elevated, and I counseled the patient to discontinue vitamin D supplementation.  Plan: -Vitamin D level today

## 2020-07-31 NOTE — Assessment & Plan Note (Signed)
Patient presents for follow-up for CVA.  Patient's strength and sensation have returned completely.  She is having no dysarthria or trouble swallowing.  Patient is now on aspirin 81 mg only.  Otherwise all of her other comorbidities are currently being managed.   Plan: - Continue ASA 81 mg.

## 2020-07-31 NOTE — Assessment & Plan Note (Signed)
Continue gabapentin 300 mg twice daily.  Titrate up to 3 times daily dosing if need be to control vasomotor symptoms.

## 2020-07-31 NOTE — Progress Notes (Signed)
CC: HTN  HPI:  [name] is a 59 y.o. female with a past medical history stated below and presents today for HTN. Please see problem based assessment and plan for additional details.  Past Medical History:  Diagnosis Date  . Adenocarcinoma in situ (AIS) of uterine cervix 2012  . Blepharitis   . History of shingles   . Ocular migraine   . Osteopenia 08/2018   T score -1.1 FRAX 22% / 0.7%  . Proctitis   . PVC (premature ventricular contraction)   . Urinary incontinence   . 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 acetaminophen (TYLENOL) 325 MG tablet Take 650 mg by mouth in the morning and at bedtime.    Marland Kitchen amLODipine (NORVASC) 5 MG tablet Take 1 tablet (5 mg total) by mouth daily. 30 tablet 11  . aspirin EC 81 MG EC tablet Take 1 tablet (81 mg total) by mouth daily. Swallow whole. 30 tablet 11  . Cholecalciferol (VITAMIN D-3) 125 MCG (5000 UT) TABS Take 5,000 Units by mouth daily.    . diclofenac Sodium (VOLTAREN) 1 % GEL Apply 4 g topically 4 (four) times daily. 420 g 0  . fluticasone (FLONASE) 50 MCG/ACT nasal spray Place 1-2 sprays into both nostrils daily as needed for allergies or rhinitis.     Marland Kitchen loratadine (CLARITIN) 10 MG tablet Take 10 mg by mouth daily as needed for allergies.     . Melatonin 10 MG TABS Take 10 mg by mouth at bedtime.    . Misc Natural Products (TURMERIC CURCUMIN) CAPS Take 1 capsule by mouth 2 (two) times daily.    . Tavaborole 5 % SOLN Apply 1 drop topically daily. (Patient taking differently: Apply 1 drop topically daily as needed (for antifungal issues- to affected toes). ) 10 mL 6  . zolpidem (AMBIEN) 10 MG tablet TAKE ONE TABLET BY MOUTH EVERY NIGHT AT BEDTIME AS NEEDED FOR SLEEP 30 tablet 5   No current facility-administered medications on file prior to visit.    Family History   Problem Relation Age of Onset  . Heart disease Mother   . Alzheimer's disease Mother   . Heart disease Father   . Diabetes Father   . Heart disease Maternal Grandmother   . Diabetes Maternal Grandmother   . Heart disease Maternal Grandfather   . Diabetes Maternal Grandfather   . Stroke Paternal Grandmother   . Breast cancer Paternal Grandmother 48  . Stroke Paternal Grandfather   . Thyroid disease Brother     Social History   Socioeconomic History  . Marital status: Single    Spouse name: Not on file  . Number of children: Not on file  . Years of education: Not on file  . Highest education level: Not on file  Occupational History  . Not on file  Tobacco Use  . Smoking status: Former Games developer  . Smokeless tobacco: Never Used  Vaping Use  . Vaping Use: Never used  Substance and Sexual Activity  . Alcohol use: Yes    Alcohol/week: 0.0 standard drinks  . Drug use: No  . Sexual activity: Not Currently    Birth control/protection: Surgical    Comment: HYST-1st intercourse 59 yo--More than 5 partners  Other Topics Concern  . Not on file  Social History Narrative  . Not on file  Social Determinants of Health   Financial Resource Strain:   . Difficulty of Paying Living Expenses: Not on file  Food Insecurity:   . Worried About Programme researcher, broadcasting/film/video in the Last Year: Not on file  . Ran Out of Food in the Last Year: Not on file  Transportation Needs:   . Lack of Transportation (Medical): Not on file  . Lack of Transportation (Non-Medical): Not on file  Physical Activity:   . Days of Exercise per Week: Not on file  . Minutes of Exercise per Session: Not on file  Stress:   . Feeling of Stress : Not on file  Social Connections:   . Frequency of Communication with Friends and Family: Not on file  . Frequency of Social Gatherings with Friends and Family: Not on file  . Attends Religious Services: Not on file  . Active Member of Clubs or Organizations: Not on file  .  Attends Banker Meetings: Not on file  . Marital Status: Not on file  Intimate Partner Violence:   . Fear of Current or Ex-Partner: Not on file  . Emotionally Abused: Not on file  . Physically Abused: Not on file  . Sexually Abused: Not on file    Review of Systems: ROS negative except for what is noted on the assessment and plan.  Vitals:   07/30/20 1325  BP: 126/75  Pulse: 100  Temp: 97.6 F (36.4 C)  TempSrc: Oral  SpO2: 98%  Weight: 172 lb (78 kg)  Height: 5\' 6"  (1.676 m)     Physical Exam: Physical Exam Constitutional:      Appearance: Normal appearance.  HENT:     Head: Normocephalic and atraumatic.  Cardiovascular:     Rate and Rhythm: Normal rate.  Pulmonary:     Effort: Pulmonary effort is normal.  Musculoskeletal:        General: No swelling. Normal range of motion.  Skin:    General: Skin is warm and dry.  Neurological:     General: No focal deficit present.     Mental Status: She is alert and oriented to person, place, and time.     Sensory: No sensory deficit.     Motor: No weakness.     Coordination: Coordination normal.     Gait: Gait normal.      Assessment & Plan:   See Encounters Tab for problem based charting.  Patient discussed with Dr. , D.O. [facility], PGY-2 Pager: [phone], Phone: [phone] Date 07/31/2020 Time 9:09 AM

## 2020-07-31 NOTE — Assessment & Plan Note (Signed)
Continue atorvastatin 40 mg daily for secondary prevention.

## 2020-08-01 ENCOUNTER — Other Ambulatory Visit: Payer: Self-pay | Admitting: Internal Medicine

## 2020-08-01 DIAGNOSIS — N951 Menopausal and female climacteric states: Secondary | ICD-10-CM

## 2020-08-01 DIAGNOSIS — I10 Essential (primary) hypertension: Secondary | ICD-10-CM

## 2020-08-01 DIAGNOSIS — E782 Mixed hyperlipidemia: Secondary | ICD-10-CM

## 2020-08-01 MED ORDER — GABAPENTIN 300 MG PO CAPS: 300.0000 mg | ORAL_CAPSULE | Freq: Three times a day (TID) | ORAL | 11 refills | Status: AC

## 2020-08-01 MED ORDER — ASPIRIN 81 MG PO TBEC: 81.0000 mg | DELAYED_RELEASE_TABLET | Freq: Every day | ORAL | 11 refills | Status: AC

## 2020-08-01 MED ORDER — DICLOFENAC SODIUM 1 % EX GEL: 4.0000 g | Freq: Four times a day (QID) | CUTANEOUS | 11 refills | Status: AC

## 2020-08-01 MED ORDER — ATORVASTATIN CALCIUM 40 MG PO TABS: 40.0000 mg | ORAL_TABLET | Freq: Every day | ORAL | 11 refills | Status: AC

## 2020-08-01 MED ORDER — AMLODIPINE BESYLATE 5 MG PO TABS: 5.0000 mg | ORAL_TABLET | Freq: Every day | ORAL | 11 refills | Status: AC

## 2020-08-01 NOTE — Telephone Encounter (Signed)
Hey there, I sure will. I am sorry about the mix up.

## 2020-08-02 NOTE — Progress Notes (Signed)
Internal Medicine Clinic Attending  Case discussed with Dr. Coe  At the time of the visit.  We reviewed the resident's history and exam and pertinent patient test results.  I agree with the assessment, diagnosis, and plan of care documented in the resident's note.  

## 2020-09-04 ENCOUNTER — Encounter: Payer: Self-pay | Admitting: Obstetrics and Gynecology

## 2020-09-04 MED ORDER — ZOLPIDEM TARTRATE 10 MG PO TABS: 10.0000 mg | ORAL_TABLET | Freq: Every evening | ORAL | 4 refills | Status: AC | PRN

## 2020-09-04 NOTE — Telephone Encounter (Signed)
I spoke with pharmacist and [place].  Looks like I am going to need to cancel it with [facility] locally and resend it in to her new [facility] pharmacy. She has 4 refills left. Filled it once 08/03/20 since you prescribed it.  ( I spoke with tech and cancelled the Rx locally.)

## 2020-12-17 ENCOUNTER — Encounter: Payer: 59 | Admitting: Obstetrics and Gynecology

## 2020-12-20 ENCOUNTER — Encounter: Payer: 59 | Admitting: Obstetrics and Gynecology

## 2021-02-27 ENCOUNTER — Other Ambulatory Visit: Payer: Self-pay

## 2021-02-27 NOTE — Telephone Encounter (Signed)
Last AEX 01/04/20 with Dr. Penni Bombard.

## 2021-10-01 ENCOUNTER — Other Ambulatory Visit: Payer: Self-pay | Admitting: Internal Medicine
